# Patient Record
Sex: Female | Born: 1965 | Race: White | Hispanic: No | Marital: Married | Smoking: Former smoker
Health system: Southern US, Community
[De-identification: ages and names within clinical notes are randomized; demographics above are authoritative.]

## PROBLEM LIST (undated history)

## (undated) DIAGNOSIS — I10 Essential (primary) hypertension: Secondary | ICD-10-CM

## (undated) HISTORY — PX: TUBAL LIGATION: SHX77

## (undated) HISTORY — PX: OTHER SURGICAL HISTORY: SHX169

## (undated) HISTORY — PX: DILATION AND CURETTAGE OF UTERUS: SHX78

## (undated) HISTORY — DX: Essential (primary) hypertension: I10

---

## 2004-04-20 ENCOUNTER — Ambulatory Visit (HOSPITAL_COMMUNITY): Admission: RE | Admit: 2004-04-20 | Discharge: 2004-04-20 | Payer: Self-pay | Admitting: Family Medicine

## 2004-12-15 ENCOUNTER — Ambulatory Visit (HOSPITAL_COMMUNITY): Admission: RE | Admit: 2004-12-15 | Discharge: 2004-12-15 | Payer: Self-pay | Admitting: Family Medicine

## 2005-01-10 ENCOUNTER — Ambulatory Visit (HOSPITAL_COMMUNITY): Admission: RE | Admit: 2005-01-10 | Discharge: 2005-01-10 | Payer: Self-pay | Admitting: Family Medicine

## 2006-02-06 ENCOUNTER — Ambulatory Visit: Payer: Self-pay | Admitting: Orthopedic Surgery

## 2007-03-22 ENCOUNTER — Ambulatory Visit: Payer: Self-pay | Admitting: Orthopedic Surgery

## 2007-03-28 ENCOUNTER — Ambulatory Visit (HOSPITAL_COMMUNITY): Admission: RE | Admit: 2007-03-28 | Discharge: 2007-03-28 | Payer: Self-pay | Admitting: Orthopedic Surgery

## 2007-04-04 ENCOUNTER — Ambulatory Visit: Payer: Self-pay | Admitting: Orthopedic Surgery

## 2008-03-19 ENCOUNTER — Ambulatory Visit (HOSPITAL_COMMUNITY): Admission: RE | Admit: 2008-03-19 | Discharge: 2008-03-19 | Payer: Self-pay | Admitting: Unknown Physician Specialty

## 2010-03-23 DIAGNOSIS — L240 Irritant contact dermatitis due to detergents: Secondary | ICD-10-CM | POA: Insufficient documentation

## 2010-12-11 ENCOUNTER — Encounter: Payer: Self-pay | Admitting: Family Medicine

## 2010-12-12 ENCOUNTER — Encounter: Payer: Self-pay | Admitting: Orthopedic Surgery

## 2012-02-06 ENCOUNTER — Other Ambulatory Visit (HOSPITAL_COMMUNITY): Payer: Self-pay | Admitting: Family Medicine

## 2012-02-06 DIAGNOSIS — Z139 Encounter for screening, unspecified: Secondary | ICD-10-CM

## 2012-02-21 ENCOUNTER — Ambulatory Visit (HOSPITAL_COMMUNITY)
Admission: RE | Admit: 2012-02-21 | Discharge: 2012-02-21 | Disposition: A | Discharge status: Home / Self Care | Payer: BC Managed Care – PPO | Source: Ambulatory Visit | Attending: Family Medicine | Admitting: Family Medicine

## 2012-02-21 DIAGNOSIS — Z139 Encounter for screening, unspecified: Secondary | ICD-10-CM

## 2012-02-21 DIAGNOSIS — Z1231 Encounter for screening mammogram for malignant neoplasm of breast: Secondary | ICD-10-CM | POA: Insufficient documentation

## 2014-11-19 ENCOUNTER — Other Ambulatory Visit: Payer: Self-pay | Admitting: Obstetrics and Gynecology

## 2014-11-19 DIAGNOSIS — R928 Other abnormal and inconclusive findings on diagnostic imaging of breast: Secondary | ICD-10-CM

## 2014-12-02 ENCOUNTER — Encounter (HOSPITAL_COMMUNITY): Payer: BC Managed Care – PPO

## 2014-12-09 ENCOUNTER — Ambulatory Visit
Admission: RE | Admit: 2014-12-09 | Discharge: 2014-12-09 | Disposition: A | Discharge status: Home / Self Care | Payer: BC Managed Care – PPO | Source: Ambulatory Visit | Attending: Obstetrics and Gynecology | Admitting: Obstetrics and Gynecology

## 2014-12-09 ENCOUNTER — Ambulatory Visit (HOSPITAL_COMMUNITY)
Admission: RE | Admit: 2014-12-09 | Discharge: 2014-12-09 | Disposition: A | Discharge status: Home / Self Care | Payer: BC Managed Care – PPO | Source: Ambulatory Visit | Attending: Obstetrics and Gynecology | Admitting: Obstetrics and Gynecology

## 2014-12-09 DIAGNOSIS — R928 Other abnormal and inconclusive findings on diagnostic imaging of breast: Secondary | ICD-10-CM

## 2015-11-18 ENCOUNTER — Ambulatory Visit: Payer: BC Managed Care – PPO | Admitting: Podiatry

## 2015-11-20 ENCOUNTER — Encounter: Payer: Self-pay | Admitting: Podiatry

## 2015-11-20 ENCOUNTER — Ambulatory Visit (INDEPENDENT_AMBULATORY_CARE_PROVIDER_SITE_OTHER): Payer: BC Managed Care – PPO | Admitting: Podiatry

## 2015-11-20 ENCOUNTER — Ambulatory Visit (INDEPENDENT_AMBULATORY_CARE_PROVIDER_SITE_OTHER): Payer: BC Managed Care – PPO

## 2015-11-20 VITALS — BP 160/104 | HR 89 | Resp 12

## 2015-11-20 DIAGNOSIS — M779 Enthesopathy, unspecified: Secondary | ICD-10-CM

## 2015-11-20 MED ORDER — TRIAMCINOLONE ACETONIDE 10 MG/ML IJ SUSP
10.0000 mg | Freq: Once | INTRAMUSCULAR | Status: AC
  Administered 2015-11-20: 10 mg

## 2015-11-20 MED ORDER — DICLOFENAC SODIUM 75 MG PO TBEC: 75.0000 mg | DELAYED_RELEASE_TABLET | Freq: Two times a day (BID) | ORAL | Status: DC

## 2015-11-20 NOTE — Progress Notes (Signed)
   Subjective:    Patient ID: Breanna Wang, female    DOB: 10/18/1966, 49 y.o.   MRN: [id number]  HPI  PT STATED RT BALL OF THE FOOT BEEN HURTING FOR 3 MONTHS. FOOT GETTING WORSE ESPECIALLY WHEN SITTING/WALKING. TRIED NO TREATMENT.  Review of Systems  Musculoskeletal: Positive for gait problem.       Objective:   Physical Exam        Assessment & Plan:

## 2015-11-20 NOTE — Progress Notes (Signed)
Subjective:     Patient ID: Breanna Wang, female   DOB: 1966-11-04, 49 y.o.   MRN: [id number]  HPI patient states I been getting a lot of pain on the inside of my right foot for the last 3 months. Patient states it's C worse when starting to walk after sitting   Review of Systems  All other systems reviewed and are negative.      Objective:   Physical Exam  Constitutional: She is oriented to person, place, and time.  Cardiovascular: Intact distal pulses.   Musculoskeletal: Normal range of motion.  Neurological: She is oriented to person, place, and time.  Skin: Skin is warm.  Nursing note and vitals reviewed.  neurovascular status intact muscle strength adequate range of motion within normal limits with patient noted to have quite a bit of discomfort in medial side of the right plantar first metatarsal with inflammation noted around the plantar medial capsule but not within the sesamoidal complex are within the metatarsal head itself. Good digital perfusion is noted and well oriented 3     Assessment:      appears to be an inflammatory capsulitis of the medial aspect of the right first metatarsal localized in nature    Plan:      H&P and condition reviewed with patient and at this time I carefully injected the medial side of the capsule 3 mg Kenalog 5 mg Xylocaine advised on physical therapy and placed on diclofenac 75 mg twice a day. Reappoint for [place] to recheck if symptoms persist

## 2017-03-28 ENCOUNTER — Ambulatory Visit (HOSPITAL_COMMUNITY)
Admission: RE | Admit: 2017-03-28 | Discharge: 2017-03-28 | Disposition: A | Discharge status: Home / Self Care | Payer: BC Managed Care – PPO | Source: Ambulatory Visit | Attending: Family Medicine | Admitting: Family Medicine

## 2017-03-28 ENCOUNTER — Other Ambulatory Visit (HOSPITAL_COMMUNITY): Payer: Self-pay | Admitting: Family Medicine

## 2017-03-28 DIAGNOSIS — R52 Pain, unspecified: Secondary | ICD-10-CM

## 2017-03-28 DIAGNOSIS — M545 Low back pain: Secondary | ICD-10-CM | POA: Diagnosis present

## 2017-09-11 ENCOUNTER — Encounter (INDEPENDENT_AMBULATORY_CARE_PROVIDER_SITE_OTHER): Payer: Self-pay | Admitting: *Deleted

## 2017-11-29 ENCOUNTER — Other Ambulatory Visit (INDEPENDENT_AMBULATORY_CARE_PROVIDER_SITE_OTHER): Payer: Self-pay | Admitting: *Deleted

## 2017-11-29 DIAGNOSIS — Z1211 Encounter for screening for malignant neoplasm of colon: Secondary | ICD-10-CM | POA: Insufficient documentation

## 2018-02-13 DIAGNOSIS — H02831 Dermatochalasis of right upper eyelid: Secondary | ICD-10-CM | POA: Insufficient documentation

## 2018-02-13 DIAGNOSIS — H02834 Dermatochalasis of left upper eyelid: Secondary | ICD-10-CM

## 2018-03-20 ENCOUNTER — Encounter (INDEPENDENT_AMBULATORY_CARE_PROVIDER_SITE_OTHER): Payer: Self-pay | Admitting: *Deleted

## 2018-03-20 ENCOUNTER — Telehealth (INDEPENDENT_AMBULATORY_CARE_PROVIDER_SITE_OTHER): Payer: Self-pay | Admitting: *Deleted

## 2018-03-20 MED ORDER — PEG 3350-KCL-NA BICARB-NACL 420 G PO SOLR: 4000.0000 mL | Freq: Once | ORAL | 0 refills | Status: AC

## 2018-03-20 NOTE — Telephone Encounter (Signed)
Patient needs trilyte 

## 2018-04-09 ENCOUNTER — Telehealth (INDEPENDENT_AMBULATORY_CARE_PROVIDER_SITE_OTHER): Payer: Self-pay | Admitting: *Deleted

## 2018-04-09 NOTE — Telephone Encounter (Signed)
Referring MD/PCP: golding   Procedure: tcs  Reason/Indication:  screening  Has patient had this procedure before?  no  If so, when, by whom and where?    Is there a family history of colon cancer?  no  Who?  What age when diagnosed?    Is patient diabetic?   no      Does patient have prosthetic heart valve or mechanical valve?  no  Do you have a pacemaker?  no  Has patient ever had endocarditis? no  Has patient had joint replacement within last 12 months?  no  Is patient constipated or do they take laxatives? no  Does patient have a history of alcohol/drug use?  no  Is patient on blood thinner such as Coumadin, Plavix and/or Aspirin? o  Medications: enalapril 20 mg bid  Allergies: nkda  Medication Adjustment per Dr Lindi Adie, NP:   Procedure date & time: 05/09/18 at 830

## 2018-04-09 NOTE — Telephone Encounter (Signed)
agree

## 2018-05-09 ENCOUNTER — Ambulatory Visit (HOSPITAL_COMMUNITY)
Admission: RE | Admit: 2018-05-09 | Discharge: 2018-05-09 | Disposition: A | Discharge status: Home / Self Care | Payer: BC Managed Care – PPO | Source: Ambulatory Visit | Attending: Internal Medicine | Admitting: Internal Medicine

## 2018-05-09 ENCOUNTER — Encounter (HOSPITAL_COMMUNITY): Payer: Self-pay | Admitting: *Deleted

## 2018-05-09 ENCOUNTER — Encounter (HOSPITAL_COMMUNITY)
Admission: RE | Disposition: A | Discharge status: Home / Self Care | Payer: Self-pay | Source: Ambulatory Visit | Attending: Internal Medicine

## 2018-05-09 ENCOUNTER — Other Ambulatory Visit: Payer: Self-pay

## 2018-05-09 DIAGNOSIS — Z8249 Family history of ischemic heart disease and other diseases of the circulatory system: Secondary | ICD-10-CM | POA: Diagnosis not present

## 2018-05-09 DIAGNOSIS — Z79899 Other long term (current) drug therapy: Secondary | ICD-10-CM | POA: Insufficient documentation

## 2018-05-09 DIAGNOSIS — Z87891 Personal history of nicotine dependence: Secondary | ICD-10-CM | POA: Diagnosis not present

## 2018-05-09 DIAGNOSIS — D123 Benign neoplasm of transverse colon: Secondary | ICD-10-CM | POA: Insufficient documentation

## 2018-05-09 DIAGNOSIS — Z1211 Encounter for screening for malignant neoplasm of colon: Secondary | ICD-10-CM | POA: Insufficient documentation

## 2018-05-09 DIAGNOSIS — I10 Essential (primary) hypertension: Secondary | ICD-10-CM | POA: Diagnosis not present

## 2018-05-09 HISTORY — PX: POLYPECTOMY: SHX5525

## 2018-05-09 HISTORY — PX: COLONOSCOPY: SHX5424

## 2018-05-09 SURGERY — COLONOSCOPY
Anesthesia: Moderate Sedation

## 2018-05-09 MED ORDER — MEPERIDINE HCL 50 MG/ML IJ SOLN
INTRAMUSCULAR | Status: AC
  Filled 2018-05-09: qty 1

## 2018-05-09 MED ORDER — MEPERIDINE HCL 50 MG/ML IJ SOLN
INTRAMUSCULAR | Status: DC | PRN
  Administered 2018-05-09 (×2): 25 mg via INTRAVENOUS

## 2018-05-09 MED ORDER — SODIUM CHLORIDE 0.9 % IV SOLN
INTRAVENOUS | Status: DC
  Administered 2018-05-09: 1000 mL via INTRAVENOUS

## 2018-05-09 MED ORDER — MIDAZOLAM HCL 5 MG/5ML IJ SOLN
INTRAMUSCULAR | Status: DC | PRN
  Administered 2018-05-09 (×3): 2 mg via INTRAVENOUS

## 2018-05-09 MED ORDER — MIDAZOLAM HCL 5 MG/5ML IJ SOLN
INTRAMUSCULAR | Status: AC
  Filled 2018-05-09: qty 10

## 2018-05-09 MED ORDER — STERILE WATER FOR IRRIGATION IR SOLN
Status: DC | PRN
  Administered 2018-05-09: 08:00:00

## 2018-05-09 NOTE — H&P (Addendum)
Breanna Wang is an 52 y.o. female.   Chief Complaint: Patient is here for colonoscopy. HPI: Patient is a 52 year old Caucasian female who is here for screening colonoscopy.  She denies abdominal pain change in bowel habits or rectal bleeding. Family history is negative for CRC.  Past Medical History:  Diagnosis Date  . Hypertension     Past Surgical History:  Procedure Laterality Date  . DILATION AND CURETTAGE OF UTERUS    . TUBAL LIGATION    . uterine ablation      Family History  Problem Relation Age of Onset  . Hypertension Mother   . Diabetes Father   . Lung cancer Father    Social History:  reports that she has quit smoking. She has never used smokeless tobacco. She reports that she drinks alcohol. She reports that she does not use drugs.  Allergies: No Known Allergies  Medications Prior to Admission  Medication Sig Dispense Refill  . enalapril (VASOTEC) 20 MG tablet Take 40 mg by mouth daily.       No results found for this or any previous visit (from the past 48 hour(s)). No results found.  ROS  Blood pressure 138/87, pulse 88, temperature 98.1 F (36.7 C), temperature source Oral, resp. rate 18, last menstrual period 05/08/2018, SpO2 98 %. Physical Exam  Constitutional: She appears well-developed and well-nourished.  HENT:  Mouth/Throat: Oropharynx is clear and moist.  Eyes: Conjunctivae are normal. No scleral icterus.  Neck: No thyromegaly present.  Cardiovascular: Normal rate, regular rhythm and normal heart sounds.  No murmur heard. Respiratory: Effort normal and breath sounds normal.  GI: Soft. She exhibits no distension and no mass. There is no tenderness.  Musculoskeletal: She exhibits no edema.  Neurological: She is alert.  Skin: Skin is warm and dry.     Assessment/Plan Average risk screening colonoscopy.  Hildred Laser, MD 05/09/2018, 8:23 AM

## 2018-05-09 NOTE — Op Note (Signed)
[facility] Patient Name: Breanna Wang Procedure Date: 05/09/2018 8:21 AM MRN: [id number] Date of Birth: 08/04/1966 Attending MD: Hildred Laser , MD CSN: [id number] Age: 52 Admit Type: Outpatient Procedure:                Colonoscopy Indications:              Screening for colorectal malignant neoplasm Providers:                Hildred Laser, MD, Jeanann Lewandowsky. Sharon Seller, RN, Nelma Rothman, Technician Referring MD:             Halford Chessman, MD Medicines:                Meperidine 50 mg IV, Midazolam 6 mg IV Complications:            No immediate complications. Estimated Blood Loss:     Estimated blood loss was minimal. Procedure:                Pre-Anesthesia Assessment:                           - Prior to the procedure, a History and Physical                            was performed, and patient medications and                            allergies were reviewed. The patient's tolerance of                            previous anesthesia was also reviewed. The risks                            and benefits of the procedure and the sedation                            options and risks were discussed with the patient.                            All questions were answered, and informed consent                            was obtained. Prior Anticoagulants: The patient has                            taken no previous anticoagulant or antiplatelet                            agents. ASA Grade Assessment: I - A normal, healthy                            patient. After reviewing the risks and benefits,  the patient was deemed in satisfactory condition to                            undergo the procedure.                           After obtaining informed consent, the colonoscope                            was passed under direct vision. Throughout the                            procedure, the patient's blood pressure, pulse, and                     oxygen saturations were monitored continuously. The                            EC-[id number] ([id number]) scope was introduced through                            the anus and advanced to the the cecum, identified                            by appendiceal orifice and ileocecal valve. The                            colonoscopy was performed without difficulty. The                            patient tolerated the procedure well. The quality                            of the bowel preparation was excellent. The                            ileocecal valve, appendiceal orifice, and rectum                            were photographed. Scope In: 8:32:30 AM Scope Out: 8:53:27 AM Scope Withdrawal Time: 0 hours 10 minutes 39 seconds  Total Procedure Duration: 0 hours 20 minutes 57 seconds  Findings:      The perianal and digital rectal examinations were normal.      A small polyp was found in the proximal transverse colon. The polyp was       sessile. Biopsies were taken with a cold forceps for histology.      The exam was otherwise normal throughout the examined colon.      The retroflexed view of the distal rectum and anal verge was normal and       showed no anal or rectal abnormalities. Impression:               - One small polyp in the proximal transverse colon.  Biopsied. Moderate Sedation:      Moderate (conscious) sedation was administered by the endoscopy nurse       and supervised by the endoscopist. The following parameters were       monitored: oxygen saturation, heart rate, blood pressure, CO2       capnography and response to care. Total physician intraservice time was       27 minutes. Recommendation:           - Patient has a contact number available for                            emergencies. The signs and symptoms of potential                            delayed complications were discussed with the                            patient. Return  to normal activities tomorrow.                            Written discharge instructions were provided to the                            patient.                           - Resume previous diet today.                           - Continue present medications.                           - No aspirin, ibuprofen, naproxen, or other                            non-steroidal anti-inflammatory drugs for 1 day.                           - Await pathology results.                           - Repeat colonoscopy is recommended. The                            colonoscopy date will be determined after pathology                            results from today's exam become available for                            review. Procedure Code(s):        --- Professional ---                           [phone], Colonoscopy, flexible; with biopsy, single  or multiple                           G0500, Moderate sedation services provided by the                            same physician or other qualified health care                            professional performing a gastrointestinal                            endoscopic service that sedation supports,                            requiring the presence of an independent trained                            observer to assist in the monitoring of the                            patient's level of consciousness and physiological                            status; initial 15 minutes of intra-service time;                            patient age 22 years or older (additional time may                            be reported with [phone], as appropriate)                           [phone], Moderate sedation services provided by the                            same physician or other qualified health care                            professional performing the diagnostic or                            therapeutic service that the sedation supports,                             requiring the presence of an independent trained                            observer to assist in the monitoring of the                            patient's level of consciousness and physiological                            status;  each additional 15 minutes intraservice                            time (List separately in addition to code for                            primary service) Diagnosis Code(s):        --- Professional ---                           Z12.11, Encounter for screening for malignant                            neoplasm of colon                           D12.3, Benign neoplasm of transverse colon (hepatic                            flexure or splenic flexure) CPT copyright 2017 American Medical Association. All rights reserved. The codes documented in this report are preliminary and upon coder review may  be revised to meet current compliance requirements. Hildred Laser, MD Hildred Laser, MD 05/09/2018 8:59:51 AM This report has been signed electronically. Number of Addenda: 0

## 2018-05-09 NOTE — Discharge Instructions (Signed)
No aspirin or NSAIDs for 24 hours. Resume enalapril at usual dose. Resume usual diet.   No driving for 24 hours Physician will call with biopsy results.       Colonoscopy, Adult, Care After This sheet gives you information about how to care for yourself after your procedure. Your doctor may also give you more specific instructions. If you have problems or questions, call your doctor. Follow these instructions at home: General instructions   For the first 24 hours after the procedure: ? Do not drive or use machinery. ? Do not sign important documents. ? Do not drink alcohol. ? Do your daily activities more slowly than normal. ? Eat foods that are soft and easy to digest. ? Rest often.  Take over-the-counter or prescription medicines only as told by your doctor.  It is up to you to get the results of your procedure. Ask your doctor, or the department performing the procedure, when your results will be ready. To help cramping and bloating:  Try walking around.  Put heat on your belly (abdomen) as told by your doctor. Use a heat source that your doctor recommends, such as a moist heat pack or a heating pad. ? Put a towel between your skin and the heat source. ? Leave the heat on for 20-30 minutes. ? Remove the heat if your skin turns bright red. This is especially important if you cannot feel pain, heat, or cold. You can get burned. Eating and drinking  Drink enough fluid to keep your pee (urine) clear or pale yellow.  Return to your normal diet as told by your doctor. Avoid heavy or fried foods that are hard to digest.  Avoid drinking alcohol for as long as told by your doctor. Contact a doctor if:  You have blood in your poop (stool) 2-3 days after the procedure. Get help right away if:  You have more than a small amount of blood in your poop.  You see large clumps of tissue (blood clots) in your poop.  Your belly is swollen.  You feel sick to your stomach  (nauseous).  You throw up (vomit).  You have a fever.  You have belly pain that gets worse, and medicine does not help your pain. This information is not intended to replace advice given to you by your health care provider. Make sure you discuss any questions you have with your health care provider. Document Released: 12/10/2010 Document Revised: 08/01/2016 Document Reviewed: 08/01/2016 Elsevier Interactive Patient Education  2017 [place].     Colon Polyps Polyps are tissue growths inside the body. Polyps can grow in many places, including the large intestine (colon). A polyp may be a round bump or a mushroom-shaped growth. You could have one polyp or several. Most colon polyps are noncancerous (benign). However, some colon polyps can become cancerous over time. What are the causes? The exact cause of colon polyps is not known. What increases the risk? This condition is more likely to develop in people who:  Have a family history of colon cancer or colon polyps.  Are older than 21 or older than 45 if they are African American.  Have inflammatory bowel disease, such as ulcerative colitis or Crohn disease.  Are overweight.  Smoke cigarettes.  Do not get enough exercise.  Drink too much alcohol.  Eat a diet that is: ? High in fat and red meat. ? Low in fiber.  Had childhood cancer that was treated with abdominal radiation.  What are  the signs or symptoms? Most polyps do not cause symptoms. If you have symptoms, they may include:  Blood coming from your rectum when having a bowel movement.  Blood in your stool.The stool may look dark red or black.  A change in bowel habits, such as constipation or diarrhea.  How is this diagnosed? This condition is diagnosed with a colonoscopy. This is a procedure that uses a lighted, flexible scope to look at the inside of your colon. How is this treated? Treatment for this condition involves removing any polyps that are  found. Those polyps will then be tested for cancer. If cancer is found, your health care provider will talk to you about options for colon cancer treatment. Follow these instructions at home: Diet  Eat plenty of fiber, such as fruits, vegetables, and whole grains.  Eat foods that are high in calcium and vitamin D, such as milk, cheese, yogurt, eggs, liver, fish, and broccoli.  Limit foods high in fat, red meats, and processed meats, such as hot dogs, sausage, bacon, and lunch meats.  Maintain a healthy weight, or lose weight if recommended by your health care provider. General instructions  Do not smoke cigarettes.  Do not drink alcohol excessively.  Keep all follow-up visits as told by your health care provider. This is important. This includes keeping regularly scheduled colonoscopies. Talk to your health care provider about when you need a colonoscopy.  Exercise every day or as told by your health care provider. Contact a health care provider if:  You have new or worsening bleeding during a bowel movement.  You have new or increased blood in your stool.  You have a change in bowel habits.  You unexpectedly lose weight. This information is not intended to replace advice given to you by your health care provider. Make sure you discuss any questions you have with your health care provider. Document Released: 08/03/2004 Document Revised: 04/14/2016 Document Reviewed: 09/28/2015 Elsevier Interactive Patient Education  [employer].

## 2018-05-14 ENCOUNTER — Encounter (HOSPITAL_COMMUNITY): Payer: Self-pay | Admitting: Internal Medicine

## 2018-10-05 ENCOUNTER — Encounter (HOSPITAL_COMMUNITY): Payer: Self-pay | Admitting: *Deleted

## 2018-10-05 ENCOUNTER — Emergency Department (HOSPITAL_COMMUNITY)
Admission: EM | Admit: 2018-10-05 | Discharge: 2018-10-05 | Disposition: A | Discharge status: Home / Self Care | Payer: BC Managed Care – PPO | Attending: Emergency Medicine | Admitting: Emergency Medicine

## 2018-10-05 ENCOUNTER — Emergency Department (HOSPITAL_COMMUNITY): Payer: BC Managed Care – PPO

## 2018-10-05 ENCOUNTER — Other Ambulatory Visit: Payer: Self-pay

## 2018-10-05 DIAGNOSIS — Z87891 Personal history of nicotine dependence: Secondary | ICD-10-CM | POA: Diagnosis not present

## 2018-10-05 DIAGNOSIS — R0602 Shortness of breath: Secondary | ICD-10-CM | POA: Insufficient documentation

## 2018-10-05 DIAGNOSIS — I10 Essential (primary) hypertension: Secondary | ICD-10-CM | POA: Insufficient documentation

## 2018-10-05 DIAGNOSIS — R002 Palpitations: Secondary | ICD-10-CM | POA: Diagnosis present

## 2018-10-05 DIAGNOSIS — Z79899 Other long term (current) drug therapy: Secondary | ICD-10-CM | POA: Diagnosis not present

## 2018-10-05 LAB — CBC WITH DIFFERENTIAL/PLATELET
Abs Immature Granulocytes: 0.03 10*3/uL (ref 0.00–0.07)
BASOS ABS: 0.1 10*3/uL (ref 0.0–0.1)
Basophils Relative: 1 %
Eosinophils Absolute: 0.4 10*3/uL (ref 0.0–0.5)
Eosinophils Relative: 3 %
HCT: 41.8 % (ref 36.0–46.0)
HEMOGLOBIN: 13.7 g/dL (ref 12.0–15.0)
IMMATURE GRANULOCYTES: 0 %
Lymphocytes Relative: 26 %
Lymphs Abs: 3 10*3/uL (ref 0.7–4.0)
MCH: 30.7 pg (ref 26.0–34.0)
MCHC: 32.8 g/dL (ref 30.0–36.0)
MCV: 93.7 fL (ref 80.0–100.0)
Monocytes Absolute: 0.7 10*3/uL (ref 0.1–1.0)
Monocytes Relative: 6 %
NEUTROS PCT: 64 %
NRBC: 0 % (ref 0.0–0.2)
Neutro Abs: 7.2 10*3/uL (ref 1.7–7.7)
Platelets: 305 10*3/uL (ref 150–400)
RBC: 4.46 MIL/uL (ref 3.87–5.11)
RDW: 13 % (ref 11.5–15.5)
WBC: 11.4 10*3/uL — ABNORMAL HIGH (ref 4.0–10.5)

## 2018-10-05 LAB — COMPREHENSIVE METABOLIC PANEL
ALBUMIN: 4.1 g/dL (ref 3.5–5.0)
ALK PHOS: 48 U/L (ref 38–126)
ALT: 13 U/L (ref 0–44)
ANION GAP: 7 (ref 5–15)
AST: 14 U/L — AB (ref 15–41)
BUN: 11 mg/dL (ref 6–20)
CALCIUM: 9.4 mg/dL (ref 8.9–10.3)
CO2: 27 mmol/L (ref 22–32)
Chloride: 106 mmol/L (ref 98–111)
Creatinine, Ser: 0.41 mg/dL — ABNORMAL LOW (ref 0.44–1.00)
GFR calc Af Amer: >60 mL/min (ref >60)
GFR calc non Af Amer: >60 mL/min (ref >60)
GLUCOSE: 90 mg/dL (ref 70–99)
Potassium: 4 mmol/L (ref 3.5–5.1)
SODIUM: 140 mmol/L (ref 135–145)
Total Bilirubin: 0.7 mg/dL (ref 0.3–1.2)
Total Protein: 7.2 g/dL (ref 6.5–8.1)

## 2018-10-05 LAB — TROPONIN I: Troponin I: <0.03 ng/mL (ref <0.03)

## 2018-10-05 NOTE — ED Provider Notes (Signed)
[facility] EMERGENCY DEPARTMENT Provider Note   CSN: [id number] Arrival date & time: 10/05/18  1110     History   Chief Complaint Chief Complaint  Patient presents with  . Irregular Heart Beat    Breanna Wang is a 52 y.o. female.  Patient complains of having palpitations and some shortness of breath.  Patient states symptoms have improved now no chest pain no sweating  The history is provided by the patient. No language interpreter was used.  Palpitations   This is a new problem. The current episode started 1 to 2 hours ago. The problem occurs rarely. The problem has been resolved. The problem is associated with an unknown factor. Pertinent negatives include no diaphoresis, no chest pain, no abdominal pain, no headaches, no back pain and no cough.    Past Medical History:  Diagnosis Date  . Hypertension     Patient Active Problem List   Diagnosis Date Noted  . Special screening for malignant neoplasms, colon 11/29/2017    Past Surgical History:  Procedure Laterality Date  . COLONOSCOPY N/A 05/09/2018   Procedure: COLONOSCOPY;  Surgeon: Rogene Houston, MD;  Location: [facility];  Service: Endoscopy;  Laterality: N/A;  830  . DILATION AND CURETTAGE OF UTERUS    . POLYPECTOMY  05/09/2018   Procedure: POLYPECTOMY;  Surgeon: Rogene Houston, MD;  Location: [facility];  Service: Endoscopy;;  colon  . TUBAL LIGATION    . uterine ablation       OB History   None      Home Medications    Prior to Admission medications   Medication Sig Start Date End Date Taking? Authorizing Provider  diltiazem (CARDIZEM CD) 180 MG 24 hr capsule Take 1 capsule by mouth daily. 08/17/18  Yes [provider]  omeprazole (PRILOSEC) 40 MG capsule Take 1 capsule by mouth daily. 09/19/18  Yes [provider]    Family History Family History  Problem Relation Age of Onset  . Hypertension Mother   . Diabetes Father   . Lung cancer Father      Social History Social History   Tobacco Use  . Smoking status: Former Research scientist (life sciences)  . Smokeless tobacco: Never Used  . Tobacco comment: quit at age 4  Substance Use Topics  . Alcohol use: Yes    Comment: occasionally  . Drug use: No     Allergies   Patient has no known allergies.   Review of Systems Review of Systems  Constitutional: Negative for appetite change, diaphoresis and fatigue.  HENT: Negative for congestion, ear discharge and sinus pressure.   Eyes: Negative for discharge.  Respiratory: Negative for cough.   Cardiovascular: Positive for palpitations. Negative for chest pain.  Gastrointestinal: Negative for abdominal pain and diarrhea.  Genitourinary: Negative for frequency and hematuria.  Musculoskeletal: Negative for back pain.  Skin: Negative for rash.  Neurological: Negative for seizures and headaches.  Psychiatric/Behavioral: Negative for hallucinations.     Physical Exam Updated Vital Signs BP (!) 144/89   Pulse 72   Resp 17   Ht 5\' 5"  (1.651 m)   Wt 81.6 kg   LMP 09/20/2018   SpO2 98%   BMI 29.95 kg/m   Physical Exam  Constitutional: She is oriented to person, place, and time. She appears well-developed.  HENT:  Head: Normocephalic.  Eyes: Conjunctivae and EOM are normal. No scleral icterus.  Neck: Neck supple. No thyromegaly present.  Cardiovascular: Normal rate and regular rhythm. Exam  reveals no gallop and no friction rub.  No murmur heard. Pulmonary/Chest: No stridor. She has no wheezes. She has no rales. She exhibits no tenderness.  Abdominal: She exhibits no distension. There is no tenderness. There is no rebound.  Musculoskeletal: Normal range of motion. She exhibits no edema.  Lymphadenopathy:    She has no cervical adenopathy.  Neurological: She is oriented to person, place, and time. She exhibits normal muscle tone. Coordination normal.  Skin: No rash noted. No erythema.  Psychiatric: She has a normal mood and affect. Her  behavior is normal.     ED Treatments / Results  Labs (all labs ordered are listed, but only abnormal results are displayed) Labs Reviewed  CBC WITH DIFFERENTIAL/PLATELET - Abnormal; Notable for the following components:      Result Value   WBC 11.4 (*)    All other components within normal limits  COMPREHENSIVE METABOLIC PANEL - Abnormal; Notable for the following components:   Creatinine, Ser 0.41 (*)    AST 14 (*)    All other components within normal limits  TROPONIN I    EKG EKG Interpretation  Date/Time:  Friday October 05 2018 11:31:38 EST Ventricular Rate:  90 PR Interval:    QRS Duration: 99 QT Interval:  370 QTC Calculation: 453 R Axis:   21 Text Interpretation:  Sinus rhythm Left atrial enlargement Confirmed by Milton Ferguson [id number]) on 10/05/2018 12:41:36 PM   Radiology Dg Chest 2 View  Result Date: 10/05/2018 CLINICAL DATA:  Rapid heart rate today. No chest pain. Weakness. History of hypertension, former smoker. EXAM: CHEST - 2 VIEW COMPARISON:  None. FINDINGS: The lungs are adequately inflated. There is no focal infiltrate. There is a subcentimeter calcified nodule in the right middle lobe anteriorly. The heart and pulmonary vascularity are normal. The mediastinum is normal in width. The trachea is midline. There is no pleural effusion. The bony thorax exhibits no acute abnormality. IMPRESSION: There is no active cardiopulmonary disease. Electronically Signed   By: David  Martinique M.D.   On: 10/05/2018 13:45    Procedures Procedures (including critical care time)  Medications Ordered in ED Medications - No data to display   Initial Impression / Assessment and Plan / ED Course  I have reviewed the triage vital signs and the nursing notes.  Pertinent labs & imaging results that were available during my care of the patient were reviewed by me and considered in my medical decision making (see chart for details).    Patient with palpitations that have  resolved.  Labs EKG chest x-ray unremarkable.  Patient will be referred to cardiology for possible stress test or monitor.  Patient will stop usage of caffeine and follow-up sooner with her family doctor if problems  Final Clinical Impressions(s) / ED Diagnoses   Final diagnoses:  Palpitations    ED Discharge Orders    None       Milton Ferguson, MD 10/05/18 1431

## 2018-10-05 NOTE — Discharge Instructions (Signed)
Follow-up with Dr. Bronson Ing or 1 of his partners in the next couple weeks for your palpitations.  Decrease caffeine usage.  Return if any problems

## 2018-10-05 NOTE — ED Triage Notes (Signed)
Fast heart rate onset today,denies pain

## 2018-10-09 ENCOUNTER — Ambulatory Visit: Payer: BC Managed Care – PPO | Admitting: Internal Medicine

## 2018-10-09 ENCOUNTER — Ambulatory Visit: Payer: BC Managed Care – PPO | Admitting: Cardiovascular Disease

## 2018-10-09 ENCOUNTER — Encounter: Payer: Self-pay | Admitting: Internal Medicine

## 2018-10-09 VITALS — BP 152/90 | HR 96 | Ht 65.0 in | Wt 179.8 lb

## 2018-10-09 DIAGNOSIS — R002 Palpitations: Secondary | ICD-10-CM

## 2018-10-09 DIAGNOSIS — R0609 Other forms of dyspnea: Secondary | ICD-10-CM

## 2018-10-09 DIAGNOSIS — I10 Essential (primary) hypertension: Secondary | ICD-10-CM | POA: Diagnosis not present

## 2018-10-09 NOTE — Patient Instructions (Signed)
Medication Instructions:  Your physician recommends that you continue on your current medications as directed. Please refer to the Current Medication list given to you today.  If you need a refill on your cardiac medications before your next appointment, please call your pharmacy.   Lab work: None ordered If you have labs (blood work) drawn today and your tests are completely normal, you will receive your results only by: Marland Kitchen MyChart Message (if you have MyChart) OR . A paper copy in the mail If you have any lab test that is abnormal or we need to change your treatment, we will call you to review the results.  Testing/Procedures: Your physician has requested that you have an echocardiogram. Echocardiography is a painless test that uses sound waves to create images of your heart. It provides your doctor with information about the size and shape of your heart and how well your heart's chambers and valves are working. This procedure takes approximately one hour. There are no restrictions for this procedure.  Your physician has recommended that you wear a holter monitor. Holter monitors are medical devices that record the heart's electrical activity. Doctors most often use these monitors to diagnose arrhythmias. Arrhythmias are problems with the speed or rhythm of the heartbeat. The monitor is a small, portable device. You can wear one while you do your normal daily activities. This is usually used to diagnose what is causing palpitations/syncope (passing out).  Your physician has recommended that you wear ambulatory BP monitor.    Follow-Up: At [facility], you and your health needs are our priority.  As part of our continuing mission to provide you with exceptional heart care, we have created designated Provider Care Teams.  These Care Teams include your primary Cardiologist (physician) and Advanced Practice Providers (APPs -  Physician Assistants and Nurse Practitioners) who all work together to  provide you with the care you need, when you need it. You will need a follow up appointment in 3 months.   Dr.Achirya or one of the following Advanced Practice Providers on your designated Care Team:   Rosaria Ferries, PA-C . Jory Sims, DNP, ANP  Any Other Special Instructions Will Be Listed Below (If Applicable).

## 2018-10-28 DIAGNOSIS — I10 Essential (primary) hypertension: Secondary | ICD-10-CM | POA: Insufficient documentation

## 2018-10-28 DIAGNOSIS — R0609 Other forms of dyspnea: Secondary | ICD-10-CM | POA: Insufficient documentation

## 2018-10-28 DIAGNOSIS — R002 Palpitations: Secondary | ICD-10-CM | POA: Insufficient documentation

## 2018-10-28 NOTE — Progress Notes (Signed)
Cardiology Office Note:    Date:  10/28/2018   ID:  Breanna Wang, DOB 1966/02/19, MRN [id number]  PCP:  Sharilyn Sites, MD  Cardiologist:  Kate Sable, MD  Electrophysiologist:  None   Referring MD: Sharilyn Sites, MD   Post hospital follow up, palpitations, SOB  History of Present Illness:    Breanna Wang is a 52 y.o. female with a hx of hypertension who presents for hospital follow-up.  She is a patient of Dr. Kate Sable, I am seeing the patient in his absence.  She went to the emergency department on November 15 with palpitations and shortness of breath.  She tells me she has had symptoms since August primarily palpitations and shortness of breath with exertion.  She does have a history of snoring and has an Epworth Sleepiness Scale score of 16 but tells me she does not have sleep apnea.  She has a 30-60-pack-year smoking history.  She has recently had a 15 pound weight loss.  She had gestational diabetes with her second pregnancy, but otherwise did not have preeclampsia.  She has an elevated blood pressure on exam today.  152/90 mmHg.  The patient denies chest pain, chest pressure, dyspnea at rest, PND, orthopnea, or leg swelling. Denies syncope or presyncope. Denies dizziness or lightheadedness.  Endorses snoring but does not have sleep apnea.  Past Medical History:  Diagnosis Date  . Hypertension     Past Surgical History:  Procedure Laterality Date  . COLONOSCOPY N/A 05/09/2018   Procedure: COLONOSCOPY;  Surgeon: Rogene Houston, MD;  [facility];  Service: Endoscopy;  Laterality: N/A;  830  . DILATION AND CURETTAGE OF UTERUS    . POLYPECTOMY  05/09/2018   Procedure: POLYPECTOMY;  Surgeon: Rogene Houston, MD;  [facility];  Service: Endoscopy;;  colon  . TUBAL LIGATION    . uterine ablation      Current Medications: Current Meds  Medication Sig  . diltiazem (CARDIZEM CD) 180 MG 24 hr capsule Take 1 capsule by mouth  daily.  Marland Kitchen omeprazole (PRILOSEC) 40 MG capsule Take 1 capsule by mouth daily.     Allergies:   Patient has no known allergies.   Social History   Socioeconomic History  . Marital status: Married    Spouse name: Not on file  . Number of children: Not on file  . Years of education: Not on file  . Highest education level: Not on file  Occupational History  . Not on file  Social Needs  . Financial resource strain: Not on file  . Food insecurity:    Worry: Not on file    Inability: Not on file  . Transportation needs:    Medical: Not on file    Non-medical: Not on file  Tobacco Use  . Smoking status: Former Research scientist (life sciences)  . Smokeless tobacco: Never Used  . Tobacco comment: quit at age 57  Substance and Sexual Activity  . Alcohol use: Yes    Comment: occasionally  . Drug use: No  . Sexual activity: Not on file  Lifestyle  . Physical activity:    Days per week: Not on file    Minutes per session: Not on file  . Stress: Not on file  Relationships  . Social connections:    Talks on phone: Not on file    Gets together: Not on file    Attends religious service: Not on file    Active member of club or organization:  Not on file    Attends meetings of clubs or organizations: Not on file    Relationship status: Not on file  Other Topics Concern  . Not on file  Social History Narrative  . Not on file     Family History: The patient's family history includes Cancer in her paternal grandmother; Diabetes in her father; High blood pressure in her brother, father, paternal grandfather, and paternal grandmother; Hypertension in her mother; Lung cancer in her father; Thyroid disease (age of onset: 53) in her mother.  ROS:   Please see the history of present illness.    All other systems reviewed and are negative.  EKGs/Labs/Other Studies Reviewed:    The following studies were reviewed today:  EKG: Normal sinus rhythm, ventricular rate 96 bpm  Recent Labs: 10/05/2018: ALT 13; BUN  11; Creatinine, Ser 0.41; Hemoglobin 13.7; Platelets 305; Potassium 4.0; Sodium 140  Recent Lipid Panel No results found for: CHOL, TRIG, HDL, CHOLHDL, VLDL, LDLCALC, LDLDIRECT  Physical Exam:    VS:  BP (!) 152/90   Pulse 96   Ht 5\' 5"  (1.651 m)   Wt 179 lb 12.8 oz (81.6 kg)   LMP 09/20/2018   BMI 29.92 kg/m     Wt Readings from Last 3 Encounters:  10/09/18 179 lb 12.8 oz (81.6 kg)  10/05/18 180 lb (81.6 kg)     Constitutional: No acute distress Eyes: pupils equally round and reactive to light, sclera non-icteric, normal conjunctiva and lids ENMT: normal dentition, moist mucous membranes Cardiovascular: regular rhythm, normal rate, no murmurs. S1 and S2 normal. Radial pulses normal bilaterally. No jugular venous distention.  Respiratory: clear to auscultation bilaterally GI : normal bowel sounds, soft and nontender. No distention.   MSK: extremities warm, well perfused. No edema.  NEURO: grossly nonfocal exam, moves all extremities. PSYCH: alert and oriented x 3, normal mood and affect.      ASSESSMENT:    1. Palpitations   2. DOE (dyspnea on exertion)   3. Essential hypertension    PLAN:    We will obtain a Holter monitor for palpitations, and a blood pressure monitor to monitor daily blood pressures.  We will also obtain an echocardiogram to evaluate for structural and functional changes with shortness of breath on exertion.  I will see her back in 3 months when testing is complete.   Medication Adjustments/Labs and Tests Ordered: Current medicines are reviewed at length with the patient today.  Concerns regarding medicines are outlined above.  Orders Placed This Encounter  Procedures  . HOLTER MONITOR - 24 HOUR  . HOLTER MONITOR - 48 HOUR  . EKG 12-Lead  . ECHOCARDIOGRAM COMPLETE   No orders of the defined types were placed in this encounter.   Patient Instructions  Medication Instructions:  Your physician recommends that you continue on your current  medications as directed. Please refer to the Current Medication list given to you today.  If you need a refill on your cardiac medications before your next appointment, please call your pharmacy.   Lab work: None ordered If you have labs (blood work) drawn today and your tests are completely normal, you will receive your results only by: Marland Kitchen MyChart Message (if you have MyChart) OR . A paper copy in the mail If you have any lab test that is abnormal or we need to change your treatment, we will call you to review the results.  Testing/Procedures: Your physician has requested that you have an echocardiogram. Echocardiography is a  painless test that uses sound waves to create images of your heart. It provides your doctor with information about the size and shape of your heart and how well your heart's chambers and valves are working. This procedure takes approximately one hour. There are no restrictions for this procedure.  Your physician has recommended that you wear a holter monitor. Holter monitors are medical devices that record the heart's electrical activity. Doctors most often use these monitors to diagnose arrhythmias. Arrhythmias are problems with the speed or rhythm of the heartbeat. The monitor is a small, portable device. You can wear one while you do your normal daily activities. This is usually used to diagnose what is causing palpitations/syncope (passing out).  Your physician has recommended that you wear ambulatory BP monitor.    Follow-Up: At [facility], you and your health needs are our priority.  As part of our continuing mission to provide you with exceptional heart care, we have created designated Provider Care Teams.  These Care Teams include your primary Cardiologist (physician) and Advanced Practice Providers (APPs -  Physician Assistants and Nurse Practitioners) who all work together to provide you with the care you need, when you need it. You will need a follow up  appointment in 3 months.   Dr.Achirya or one of the following Advanced Practice Providers on your designated Care Team:   Rosaria Ferries, PA-C . Jory Sims, DNP, ANP  Any Other Special Instructions Will Be Listed Below (If Applicable).      Signed, Elouise Munroe, MD  [place]

## 2018-11-12 ENCOUNTER — Ambulatory Visit (INDEPENDENT_AMBULATORY_CARE_PROVIDER_SITE_OTHER): Payer: BC Managed Care – PPO

## 2018-11-12 ENCOUNTER — Other Ambulatory Visit: Payer: Self-pay

## 2018-11-12 ENCOUNTER — Encounter: Payer: Self-pay | Admitting: *Deleted

## 2018-11-12 ENCOUNTER — Ambulatory Visit (HOSPITAL_COMMUNITY): Payer: BC Managed Care – PPO | Attending: Thoracic Diseases

## 2018-11-12 DIAGNOSIS — R002 Palpitations: Secondary | ICD-10-CM | POA: Diagnosis not present

## 2018-11-12 DIAGNOSIS — I10 Essential (primary) hypertension: Secondary | ICD-10-CM

## 2018-11-12 DIAGNOSIS — R0609 Other forms of dyspnea: Secondary | ICD-10-CM | POA: Insufficient documentation

## 2018-11-12 NOTE — Progress Notes (Signed)
Patient ID: Breanna Wang, female   DOB: [birth year], 52 y.o.   MRN: [id number]  24 hour ambulatory blood pressure monitor applied using standard adult cuff.

## 2018-11-13 ENCOUNTER — Telehealth: Payer: Self-pay

## 2018-11-13 NOTE — Telephone Encounter (Signed)
-----   Message from Elouise Munroe, MD sent at 11/13/2018 10:17 AM EST ----- Normal pump function of the heart with decreased relaxation function. Normal valve function.  Shortness of breath may be contributed to by this impaired relaxation function of the heart. We should focus on treating hypertension as well as considering low dose diuretic.  I would recommend starting losartan 25 mg daily and plan for uptitration.   Could also consider adding lasix 20 mg daily or as needed for shortness of breath, and spironolactone 25 mg in the future for added BP control.  If she would like to discuss medication therapy we can arrange for sooner follow up.

## 2018-11-13 NOTE — Telephone Encounter (Signed)
Called to give pt echo results and Dr.Acharya's recommendation. [email or web address].

## 2018-11-13 NOTE — Telephone Encounter (Signed)
Follow up:   Patient call back concerning her results.  Please call patient.

## 2018-11-16 MED ORDER — LOSARTAN POTASSIUM 25 MG PO TABS: 25.0000 mg | ORAL_TABLET | Freq: Every day | ORAL | 3 refills | Status: DC

## 2018-11-16 NOTE — Telephone Encounter (Signed)
Left message to call back  

## 2018-11-16 NOTE — Telephone Encounter (Signed)
Spoke to patient, patient aware and verbalized understanding.  rx sent to pharmacy.  appt rescheduled for 1/18 with Dr. Margaretann Loveless

## 2018-12-07 ENCOUNTER — Encounter: Payer: Self-pay | Admitting: Internal Medicine

## 2018-12-07 ENCOUNTER — Ambulatory Visit: Payer: BC Managed Care – PPO | Admitting: Internal Medicine

## 2018-12-07 VITALS — BP 142/87 | HR 67 | Ht 65.0 in | Wt 183.4 lb

## 2018-12-07 DIAGNOSIS — R002 Palpitations: Secondary | ICD-10-CM | POA: Diagnosis not present

## 2018-12-07 DIAGNOSIS — G4719 Other hypersomnia: Secondary | ICD-10-CM

## 2018-12-07 DIAGNOSIS — I10 Essential (primary) hypertension: Secondary | ICD-10-CM

## 2018-12-07 DIAGNOSIS — R0683 Snoring: Secondary | ICD-10-CM | POA: Diagnosis not present

## 2018-12-07 DIAGNOSIS — I491 Atrial premature depolarization: Secondary | ICD-10-CM

## 2018-12-07 MED ORDER — LOSARTAN POTASSIUM 50 MG PO TABS: 50.0000 mg | ORAL_TABLET | Freq: Every day | ORAL | 3 refills | Status: DC

## 2018-12-07 NOTE — Patient Instructions (Signed)
Medication Instructions:  Your physician has recommended you make the following change in your medication:   INCREASE Losartan to 50mg  daily. An Rx has been sent to your pharmacy.  If you need a refill on your cardiac medications before your next appointment, please call your pharmacy.   Lab work: None ordered If you have labs (blood work) drawn today and your tests are completely normal, you will receive your results only by: Marland Kitchen MyChart Message (if you have MyChart) OR . A paper copy in the mail If you have any lab test that is abnormal or we need to change your treatment, we will call you to review the results.  Testing/Procedures: Your physician has recommended that you have a sleep study. This test records several body functions during sleep, including: brain activity, eye movement, oxygen and carbon dioxide blood levels, heart rate and rhythm, breathing rate and rhythm, the flow of air through your mouth and nose, snoring, body muscle movements, and chest and belly movement. (Our sleep coordinator will contact regarding the next steps)  Follow-Up: At [facility], you and your health needs are our priority.  As part of our continuing mission to provide you with exceptional heart care, we have created designated Provider Care Teams.  These Care Teams include your primary Cardiologist (physician) and Advanced Practice Providers (APPs -  Physician Assistants and Nurse Practitioners) who all work together to provide you with the care you need, when you need it. You will need a follow up appointment in 3 months.

## 2018-12-07 NOTE — Progress Notes (Signed)
Cardiology Office Note:    Date:  12/07/2018   ID:  Breanna Wang, DOB [birth year], MRN [id number]  PCP:  Sharilyn Sites, MD  Cardiologist:  Kate Sable, MD  Electrophysiologist:  None   Referring MD: Sharilyn Sites, MD   Follow-up hypertension and palpitations  History of Present Illness:    Breanna Wang is a 53 y.o. female with a hx of hypertension who presents today for follow-up of palpitations and hypertension.  We performed a 24-hour blood pressure monitor which showed mildly elevated blood pressures during the awake period.  Normal blood pressures while sleeping.  We also performed an echocardiogram which showed mild focal basal hypertrophy and grade 2 diastolic dysfunction indicating high left ventricular filling pressures.  With a history of hypertension and grade 2 diastolic dysfunction I recommended afterload reduction, and we started losartan 25 mg daily.  She has been tolerating this very well and feels that her blood pressures have improved.  She has excessive daytime sleepiness and fatigue, and endorses snoring.  Her Epworth Sleepiness Scale score is 17.  She has previously completed a home sleep assessment which showed borderline for sleep apnea and no action was taken.  She continues to endorse 2 hours of palpitations daily, but is not particularly bothered by this.  We discussed that these are likely arising from infrequent supraventricular ectopy as this correlates well on her 48-hour Holter monitor.  We discussed medical therapy for treatment of palpitations today.  The patient denies chest pain, chest pressure, dyspnea at rest or with exertion, PND, orthopnea, or leg swelling. Denies syncope or presyncope. Denies dizziness or lightheadedness.   Past Medical History:  Diagnosis Date  . Hypertension     Past Surgical History:  Procedure Laterality Date  . COLONOSCOPY N/A 05/09/2018   Procedure: COLONOSCOPY;  Surgeon: Rogene Houston, MD;   [facility];  Service: Endoscopy;  Laterality: N/A;  830  . DILATION AND CURETTAGE OF UTERUS    . POLYPECTOMY  05/09/2018   Procedure: POLYPECTOMY;  Surgeon: Rogene Houston, MD;  [facility];  Service: Endoscopy;;  colon  . TUBAL LIGATION    . uterine ablation      Current Medications: Current Meds  Medication Sig  . losartan (COZAAR) 50 MG tablet Take 1 tablet (50 mg total) by mouth daily.  Marland Kitchen omeprazole (PRILOSEC) 40 MG capsule Take 1 capsule by mouth daily.  . [DISCONTINUED] losartan (COZAAR) 25 MG tablet Take 1 tablet (25 mg total) by mouth daily.     Allergies:   Patient has no known allergies.   Social History   Socioeconomic History  . Marital status: Married    Spouse name: Not on file  . Number of children: Not on file  . Years of education: Not on file  . Highest education level: Not on file  Occupational History  . Not on file  Social Needs  . Financial resource strain: Not on file  . Food insecurity:    Worry: Not on file    Inability: Not on file  . Transportation needs:    Medical: Not on file    Non-medical: Not on file  Tobacco Use  . Smoking status: Former Research scientist (life sciences)  . Smokeless tobacco: Never Used  . Tobacco comment: quit at age 12  Substance and Sexual Activity  . Alcohol use: Yes    Comment: occasionally  . Drug use: No  . Sexual activity: Not on file  Lifestyle  . Physical activity:  Days per week: Not on file    Minutes per session: Not on file  . Stress: Not on file  Relationships  . Social connections:    Talks on phone: Not on file    Gets together: Not on file    Attends religious service: Not on file    Active member of club or organization: Not on file    Attends meetings of clubs or organizations: Not on file    Relationship status: Not on file  Other Topics Concern  . Not on file  Social History Narrative  . Not on file     Family History: The patient's family history includes Cancer in her paternal  grandmother; Diabetes in her father; High blood pressure in her brother, father, paternal grandfather, and paternal grandmother; Hypertension in her mother; Lung cancer in her father; Thyroid disease (age of onset: 28) in her mother.  ROS:   Please see the history of present illness.    All other systems reviewed and are negative.  EKGs/Labs/Other Studies Reviewed:    The following studies were reviewed today:  EKG: Not performed today  Recent Labs: 10/05/2018: ALT 13; BUN 11; Creatinine, Ser 0.41; Hemoglobin 13.7; Platelets 305; Potassium 4.0; Sodium 140  Recent Lipid Panel No results found for: CHOL, TRIG, HDL, CHOLHDL, VLDL, LDLCALC, LDLDIRECT  Physical Exam:    VS:  BP (!) 142/87   Pulse 67   Ht 5\' 5"  (1.651 m)   Wt 183 lb 6.4 oz (83.2 kg)   SpO2 100%   BMI 30.52 kg/m     Wt Readings from Last 3 Encounters:  12/07/18 183 lb 6.4 oz (83.2 kg)  10/09/18 179 lb 12.8 oz (81.6 kg)  10/05/18 180 lb (81.6 kg)     Constitutional: No acute distress Eyes: pupils equally round and reactive to light, sclera non-icteric, normal conjunctiva and lids ENMT: normal dentition, moist mucous membranes Cardiovascular: regular rhythm, normal rate, no murmurs. S1 and S2 normal. Radial pulses normal bilaterally. No jugular venous distention.  Respiratory: clear to auscultation bilaterally GI : normal bowel sounds, soft and nontender. No distention.   MSK: extremities warm, well perfused. No edema.  NEURO: grossly nonfocal exam, moves all extremities. PSYCH: alert and oriented x 3, normal mood and affect.   ASSESSMENT:    1. Essential hypertension   2. Snoring   3. Excessive daytime sleepiness   4. Palpitations   5. PAC (premature atrial contraction)    PLAN:    Hypertension- her blood pressures remained elevated and we will uptitrate the dose of losartan to 50 mg daily.  Will monitor for response.  Excessive daytime sleepiness- Epworth Sleepiness Scale score is 17, we have  participated in shared decision making and determined that pursuing a sleep study would be best.  She will follow this up.  Palpitations/PACs- PACs are infrequent but do correlate with symptoms.  I have offered her metoprolol succinate 25 mg daily for treatment of palpitations, however she would like to take less medication and will call us if she would like to start this medication in the future.  I am fine with no medical therapy at this time.  She may in fact have benefit from treatment of potential sleep apnea from the standpoint of PACs.  We will see her back in 3 months time to continue to address hypertension.  Medication Adjustments/Labs and Tests Ordered: Current medicines are reviewed at length with the patient today.  Concerns regarding medicines are outlined above.  Orders Placed  This Encounter  Procedures  . Split night study   Meds ordered this encounter  Medications  . losartan (COZAAR) 50 MG tablet    Sig: Take 1 tablet (50 mg total) by mouth daily.    Dispense:  90 tablet    Refill:  3    Patient Instructions  Medication Instructions:  Your physician has recommended you make the following change in your medication:   INCREASE Losartan to 50mg  daily. An Rx has been sent to your pharmacy.  If you need a refill on your cardiac medications before your next appointment, please call your pharmacy.   Lab work: None ordered If you have labs (blood work) drawn today and your tests are completely normal, you will receive your results only by: Marland Kitchen MyChart Message (if you have MyChart) OR . A paper copy in the mail If you have any lab test that is abnormal or we need to change your treatment, we will call you to review the results.  Testing/Procedures: Your physician has recommended that you have a sleep study. This test records several body functions during sleep, including: brain activity, eye movement, oxygen and carbon dioxide blood levels, heart rate and rhythm, breathing  rate and rhythm, the flow of air through your mouth and nose, snoring, body muscle movements, and chest and belly movement. (Our sleep coordinator will contact regarding the next steps)  Follow-Up: At [facility], you and your health needs are our priority.  As part of our continuing mission to provide you with exceptional heart care, we have created designated Provider Care Teams.  These Care Teams include your primary Cardiologist (physician) and Advanced Practice Providers (APPs -  Physician Assistants and Nurse Practitioners) who all work together to provide you with the care you need, when you need it. You will need a follow up appointment in 3 months.          Signed, Elouise Munroe, MD  12/07/2018 4:24 PM    [place]

## 2018-12-11 ENCOUNTER — Telehealth: Payer: Self-pay | Admitting: *Deleted

## 2018-12-11 NOTE — Telephone Encounter (Signed)
Left sleep study appointment details on Cell VM. ( ok per dpr)

## 2019-01-04 DIAGNOSIS — N951 Menopausal and female climacteric states: Secondary | ICD-10-CM | POA: Insufficient documentation

## 2019-01-04 DIAGNOSIS — R928 Other abnormal and inconclusive findings on diagnostic imaging of breast: Secondary | ICD-10-CM | POA: Insufficient documentation

## 2019-01-04 DIAGNOSIS — N898 Other specified noninflammatory disorders of vagina: Secondary | ICD-10-CM | POA: Insufficient documentation

## 2019-01-10 ENCOUNTER — Encounter (INDEPENDENT_AMBULATORY_CARE_PROVIDER_SITE_OTHER): Payer: Self-pay

## 2019-01-10 ENCOUNTER — Ambulatory Visit: Payer: BC Managed Care – PPO | Admitting: Internal Medicine

## 2019-01-10 ENCOUNTER — Encounter: Payer: Self-pay | Admitting: Internal Medicine

## 2019-01-10 VITALS — BP 142/89 | HR 89 | Ht 65.0 in | Wt 182.0 lb

## 2019-01-10 DIAGNOSIS — R002 Palpitations: Secondary | ICD-10-CM

## 2019-01-10 DIAGNOSIS — R0609 Other forms of dyspnea: Secondary | ICD-10-CM | POA: Diagnosis not present

## 2019-01-10 DIAGNOSIS — I491 Atrial premature depolarization: Secondary | ICD-10-CM

## 2019-01-10 DIAGNOSIS — I10 Essential (primary) hypertension: Secondary | ICD-10-CM

## 2019-01-10 NOTE — Progress Notes (Signed)
Cardiology Office Note:    Date:  01/10/2019   ID:  Breanna Wang, DOB 28-Feb-1966, MRN [id number]  PCP:  Sharilyn Sites, MD  Cardiologist:  Kate Sable, MD  Electrophysiologist:  None   Referring MD: Sharilyn Sites, MD   Hypertension f/u  History of Present Illness:    Breanna Wang is a 53 y.o. female with a hx of hypertension who presents for follow up of blood pressure and review of testing.   Echocardiogram, ambulatory blood pressure monitor, and 48-hour Holter monitor reviewed with the patient.  She shares with me that her blood pressures remain elevated at home, and on today's evaluation, her blood pressure is 142/90 in the left arm and 142/89 in the right arm.She has been taking losartan 50 mg daily as previously prescribed.  She is tolerating this medication well.  Her echocardiogram demonstrates a preserved ejection fraction with grade 2 diastolic dysfunction and a medial E/E prime of 12 suggesting indeterminate filling pressure.  There is mild increased thickness of the basal septum.  Her Holter monitor showed infrequent supraventricular ectopy likely contributing to the sensation of fluttering in her chest.  Her ambulatory blood pressure monitor demonstrated mildly elevated blood pressures while awake, and no significant hypertension while sleeping.  We have discussed that for a young and otherwise healthy person, it would be reasonable to have a goal blood pressure of 120/80 mmHg.  We are not at goal yet, and we will uptitrate losartan as our first attempt.  She is not having significant symptoms at this time.  She does intermittently report some fluttering in her chest, and we have discussed the etiology of this.  I have offered medical therapy for symptomatic palpitations, however she would like to monitor these prior to starting additional therapy.     Past Medical History:  Diagnosis Date  . Hypertension     Past Surgical History:  Procedure  Laterality Date  . COLONOSCOPY N/A 05/09/2018   Procedure: COLONOSCOPY;  Surgeon: Rogene Houston, MD;  Location: [facility];  Service: Endoscopy;  Laterality: N/A;  830  . DILATION AND CURETTAGE OF UTERUS    . POLYPECTOMY  05/09/2018   Procedure: POLYPECTOMY;  Surgeon: Rogene Houston, MD;  Location: [facility];  Service: Endoscopy;;  colon  . TUBAL LIGATION    . uterine ablation      Current Medications: No outpatient medications have been marked as taking for the 01/10/19 encounter (Office Visit) with Elouise Munroe, MD.     Allergies:   Patient has no known allergies.   Social History   Socioeconomic History  . Marital status: Married    Spouse name: Not on file  . Number of children: Not on file  . Years of education: Not on file  . Highest education level: Not on file  Occupational History  . Not on file  Social Needs  . Financial resource strain: Not on file  . Food insecurity:    Worry: Not on file    Inability: Not on file  . Transportation needs:    Medical: Not on file    Non-medical: Not on file  Tobacco Use  . Smoking status: Former Research scientist (life sciences)  . Smokeless tobacco: Never Used  . Tobacco comment: quit at age 57  Substance and Sexual Activity  . Alcohol use: Yes    Comment: occasionally  . Drug use: No  . Sexual activity: Not on file  Lifestyle  . Physical activity:    Days  per week: Not on file    Minutes per session: Not on file  . Stress: Not on file  Relationships  . Social connections:    Talks on phone: Not on file    Gets together: Not on file    Attends religious service: Not on file    Active member of club or organization: Not on file    Attends meetings of clubs or organizations: Not on file    Relationship status: Not on file  Other Topics Concern  . Not on file  Social History Narrative  . Not on file     Family History: The patient's family history includes Cancer in her paternal grandmother; Diabetes in her father; High  blood pressure in her brother, father, paternal grandfather, and paternal grandmother; Hypertension in her mother; Lung cancer in her father; Thyroid disease (age of onset: 61) in her mother.  ROS:   Please see the history of present illness.    All other systems reviewed and are negative.  EKGs/Labs/Other Studies Reviewed:    The following studies were reviewed today:  EKG: Not performed today  Recent Labs: 10/05/2018: ALT 13; BUN 11; Creatinine, Ser 0.41; Hemoglobin 13.7; Platelets 305; Potassium 4.0; Sodium 140  Recent Lipid Panel No results found for: CHOL, TRIG, HDL, CHOLHDL, VLDL, LDLCALC, LDLDIRECT  Physical Exam:    VS:  BP (!) 142/89   Pulse 89   Ht 5\' 5"  (1.651 m)   Wt 182 lb (82.6 kg)   BMI 30.29 kg/m     Wt Readings from Last 3 Encounters:  01/10/19 182 lb (82.6 kg)  12/07/18 183 lb 6.4 oz (83.2 kg)  10/09/18 179 lb 12.8 oz (81.6 kg)     Constitutional: No acute distress ENMT:  moist mucous membranes Cardiovascular: regular rhythm, normal rate, no murmurs. S1 and S2 normal. Radial pulses normal bilaterally. No jugular venous distention.  Respiratory: clear to auscultation bilaterally GI : normal bowel sounds, soft and nontender. No distention.   MSK: extremities warm, well perfused. No edema.  NEURO: grossly nonfocal exam, moves all extremities. PSYCH: alert and oriented x 3, normal mood and affect.    ASSESSMENT:    1. Essential hypertension   2. Palpitations   3. DOE (dyspnea on exertion)   4. PAC (premature atrial contraction)    PLAN:     We will plan to uptitrate losartan to 75 mg daily and monitor for response.  If blood pressure remains above goal, we will plan to uptitrate to 100 mg daily.  At that time if blood pressures are still above goal, we can consider the addition of a second agent such as amlodipine, or spironolactone with her evidence of diastolic dysfunction for additional blood pressure control.  We will be in contact by phone to  titrate these medications.  She has been encouraged to contact the office if she has any questions or concerns or any adverse symptoms from her medical therapy.  She has normal renal function.   Medication Adjustments/Labs and Tests Ordered: Current medicines are reviewed at length with the patient today.  Concerns regarding medicines are outlined above.  No orders of the defined types were placed in this encounter.  No orders of the defined types were placed in this encounter.   Patient Instructions  Medication Instructions:  Dr. has recommended you make the following change in your medication:  INCREASE Losartan to 75mg  daily  If you need a refill on your cardiac medications before your next appointment, please  call your pharmacy.   Lab work: None ordered  Testing/Procedures: None ordered  Follow-Up: At [facility], you and your health needs are our priority.  As part of our continuing mission to provide you with exceptional heart care, we have created designated Provider Care Teams.  These Care Teams include your primary Cardiologist (physician) and Advanced Practice Providers (APPs -  Physician Assistants and Nurse Practitioners) who all work together to provide you with the care you need, when you need it. You will need a follow up appointment in 5 months.  Please call our office 2 months in advance to schedule this appointment.  You may see  Dr. or one of the following Advanced Practice Providers on your designated Care Team:   Rosaria Ferries, PA-C . Jory Sims, DNP, ANP  Any Other Special Instructions Will Be Listed Below (If Applicable). Dr. has requested that you regularly monitor and record your blood pressure readings at home. Please use the same machine at the same time of day to check your readings and record them.   Your blood pressure goal <130/80. If your BP remains consistently elevated over the next 2 weeks increase Losartan to 100mg   daily        Signed, Elouise Munroe, MD  01/10/2019 10:41 AM    [place]

## 2019-01-10 NOTE — Patient Instructions (Signed)
Medication Instructions:  Dr.Acharya has recommended you make the following change in your medication:  INCREASE Losartan to 75mg  daily  If you need a refill on your cardiac medications before your next appointment, please call your pharmacy.   Lab work: None ordered  Testing/Procedures: None ordered  Follow-Up: At [facility], you and your health needs are our priority.  As part of our continuing mission to provide you with exceptional heart care, we have created designated Provider Care Teams.  These Care Teams include your primary Cardiologist (physician) and Advanced Practice Providers (APPs -  Physician Assistants and Nurse Practitioners) who all work together to provide you with the care you need, when you need it. You will need a follow up appointment in 5 months.  Please call our office 2 months in advance to schedule this appointment.  You may see  Dr.Acharya or one of the following Advanced Practice Providers on your designated Care Team:   Rosaria Ferries, PA-C . Jory Sims, DNP, ANP  Any Other Special Instructions Will Be Listed Below (If Applicable). Dr.Acharya has requested that you regularly monitor and record your blood pressure readings at home. Please use the same machine at the same time of day to check your readings and record them.   Your blood pressure goal <130/80. If your BP remains consistently elevated over the next 2 weeks increase Losartan to 100mg  daily

## 2019-01-22 ENCOUNTER — Telehealth: Payer: Self-pay

## 2019-01-22 NOTE — Telephone Encounter (Addendum)
Spoke with the patient. Patient sts that after the Losartan increase ger BP was still running high. 130's/90's. She increased her Losartan to 100mg  daily 3 days ago. Her BP is about the same. Adv her to continue the higher dose pf 100mg  daily. She should continue to monitor her BP daily. She will contact the office late this week or early next week to provide her BP readings. Pt sts that she will need a refill at that time if she is going to remain on the higher dose.

## 2019-01-22 NOTE — Telephone Encounter (Signed)
Patient Losartan was increased by Dr.Acharya on 01/10/19 to 75mg  daily. The patient was given the following instructions.  Your blood pressure goal <130/80. If your BP remains consistently elevated over the next 2 weeks increase Losartan to 100mg  daily  Contacted the patient for an update. lmtcb.

## 2019-02-07 ENCOUNTER — Telehealth: Payer: Self-pay | Admitting: Internal Medicine

## 2019-02-07 MED ORDER — AMLODIPINE BESYLATE 5 MG PO TABS: 5.0000 mg | ORAL_TABLET | Freq: Every day | ORAL | 11 refills | Status: DC

## 2019-02-07 NOTE — Telephone Encounter (Signed)
Spoke with the patient and made her aware of Dr.Acharya's recommendation below. Patient is agreeable with the plan. Rx sent to the pt pharmacy for Amlodipine 5mg  daily #30 R-11. Pt will contact the office in 2-3 wks to provide her BP readings for Dr.Acharya to review. Pt questions answered. Pt verbalized understanding and voiced appreciation for the assistance.

## 2019-02-07 NOTE — Telephone Encounter (Signed)
Blood pressures are reasonable, but goal is 120/80.   Let's start amlodipine 5 mg daily and monitor blood pressures. We can titrate as needed.   Please have her contact us in 2-3 weeks after starting amlodipine in addition to losartan 100 mg daily and see how it is working.   Amlodipine side effects : mild constipation, mild leg swelling.

## 2019-02-07 NOTE — Telephone Encounter (Signed)
Called the pt to give her Dr.Acharya' s recommendations below. lmtcb.

## 2019-02-07 NOTE — Telephone Encounter (Addendum)
Spoke with the pt. She has been taking the Losartan 100mg  daily for 2 weeks at the instruction of Dr.Acharya. She sts that the BP readings she provided reflect what her BP has been averaging. Adv her that BP readings are not to goal based on Dr.Acharya's recommendation of <130/80. Adv the pt that I will fwd the update to Dr.Acharya and we will call her back with her recommendation.

## 2019-02-07 NOTE — Telephone Encounter (Signed)
Pt c/o BP issue: STAT if pt c/o blurred vision, one-sided weakness or slurred speech  1. What are your last 5 BP readings? 133/95, 132/98, 130/87  2. Are you having any other symptoms (ex. Dizziness, headache, blurred vision, passed out)?no  3. What is your BP issue? High for her- Dr Margaretann Loveless had put her on 100 mg of Losartan and wanted to see how it worked for 2 weeks.

## 2019-03-11 ENCOUNTER — Other Ambulatory Visit: Payer: Self-pay

## 2019-03-11 ENCOUNTER — Telehealth: Payer: Self-pay | Admitting: Internal Medicine

## 2019-03-11 ENCOUNTER — Ambulatory Visit: Payer: BC Managed Care – PPO | Admitting: Internal Medicine

## 2019-03-11 MED ORDER — LOSARTAN POTASSIUM 100 MG PO TABS: 100.0000 mg | ORAL_TABLET | Freq: Every day | ORAL | 1 refills | Status: DC

## 2019-03-11 NOTE — Telephone Encounter (Signed)
New Message:   Patient concerning her medications. Please call patient.

## 2019-03-11 NOTE — Telephone Encounter (Signed)
Medication Losartan 100 mg called into pharmacy.

## 2019-03-22 ENCOUNTER — Telehealth: Payer: Self-pay | Admitting: Internal Medicine

## 2019-03-22 NOTE — Telephone Encounter (Signed)
lmsg to call back.  pateint needs appt in July. dc

## 2019-04-04 NOTE — Telephone Encounter (Signed)
5 month recall. LMTCB. Patient needs appt in July. Please schedule/dc

## 2019-06-27 ENCOUNTER — Telehealth: Payer: Self-pay | Admitting: Internal Medicine

## 2019-06-27 NOTE — Telephone Encounter (Signed)
LVM for patient to call and schedule office visit with another physician.

## 2019-07-04 ENCOUNTER — Ambulatory Visit: Payer: BC Managed Care – PPO | Admitting: Internal Medicine

## 2019-08-27 ENCOUNTER — Other Ambulatory Visit: Payer: Self-pay

## 2019-08-27 MED ORDER — LOSARTAN POTASSIUM 100 MG PO TABS: 100.0000 mg | ORAL_TABLET | Freq: Every day | ORAL | 0 refills | Status: DC

## 2019-12-17 ENCOUNTER — Ambulatory Visit (INDEPENDENT_AMBULATORY_CARE_PROVIDER_SITE_OTHER): Payer: BC Managed Care – PPO | Admitting: Otolaryngology

## 2019-12-19 NOTE — Progress Notes (Signed)
Cardiology Office Note:    Date:  12/20/2019   ID:  Breanna Wang, DOB 12-Jun-1966, MRN [id number]  PCP:  Sharilyn Sites, MD  Cardiologist:  Kate Sable, MD  Electrophysiologist:  None   Referring MD: Sharilyn Sites, MD   Chief Complaint: dizziness  History of Present Illness:    Breanna Wang is a 54 y.o. female with a hx of hypertension who presents today for follow-up in the setting of new dizziness.  She had COVID-19 infection around Thanksgiving and has had a protracted recovery.  She had loss of taste and smell for over 6 weeks.  She continues to have a relatively dry cough with chest congestion that is not productive of sputum.  She has had situational stressors.  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  She describes her dizziness as the room spinning, and she is affected by eye movements and change in position.  She denies chest pain, shortness of breath, palpitations.  Denies syncope.  She denies a sensation of the room going dark or feeling as if she is going to pass out.  She endorses snoring and daytime fatigue and needs a referral to a sleep study.  Her dizziness has not responded to vestibular maneuvers.  She has gone to a chiropractor to have these done.  She finds eye movements are the most provoking factor.  She had some swelling behind her right ear and she felt as if she must have a sinus infection with her symptoms.  She was prescribed an antibiotic and steroids last Saturday by her primary care provider with no significant improvement.  She wonders if her blood pressure is elevated and that is the source of her symptoms.  Past Medical History:  Diagnosis Date  . Hypertension     Past Surgical History:  Procedure Laterality Date  . COLONOSCOPY N/A 05/09/2018   Procedure: COLONOSCOPY;  Surgeon: Rogene Houston, MD;  Location: [facility];  Service: Endoscopy;  Laterality: N/A;  830  . DILATION AND CURETTAGE OF UTERUS    . POLYPECTOMY  05/09/2018   Procedure: POLYPECTOMY;  Surgeon: Rogene Houston, MD;  Location: [facility];  Service: Endoscopy;;  colon  . TUBAL LIGATION    . uterine ablation      Current Medications: Current Meds  Medication Sig  . amoxicillin (AMOXIL) 500 MG capsule   . losartan (COZAAR) 100 MG tablet Take 1 tablet (100 mg total) by mouth daily.  . [DISCONTINUED] amLODipine (NORVASC) 5 MG tablet Take 1 tablet (5 mg total) by mouth daily.     Allergies:   Patient has no known allergies.   Social History   Socioeconomic History  . Marital status: Married    Spouse name: Not on file  . Number of children: Not on file  . Years of education: Not on file  . Highest education level: Not on file  Occupational History  . Not on file  Tobacco Use  . Smoking status: Former Research scientist (life sciences)  . Smokeless tobacco: Never Used  . Tobacco comment: quit at age 59  Substance and Sexual Activity  . Alcohol use: Yes    Comment: occasionally  . Drug use: No  . Sexual activity: Not on file  Other Topics Concern  . Not on file  Social History Narrative  . Not on file   Social Determinants of Health   Financial  Resource Strain:   . Difficulty of Paying Living Expenses: Not on file  Food Insecurity:   . Worried About Charity fundraiser in the Last Year: Not on file  . Ran Out of Food in the Last Year: Not on file  Transportation Needs:   . Lack of Transportation (Medical): Not on file  . Lack of Transportation (Non-Medical): Not on file  Physical Activity:   . Days of Exercise per Week: Not on file  . Minutes of Exercise per Session: Not on file  Stress:   . Feeling of Stress : Not on file  Social Connections:   . Frequency of Communication with Friends and Family: Not on file  . Frequency of Social Gatherings with Friends and Family: Not on file  . Attends Religious Services: Not on file  .  Active Member of Clubs or Organizations: Not on file  . Attends Archivist Meetings: Not on file  . Marital Status: Not on file     Family History: The patient's family history includes Cancer in her paternal grandmother; Diabetes in her father; High blood pressure in her brother, father, paternal grandfather, and paternal grandmother; Hypertension in her mother; Lung cancer in her father; Thyroid disease (age of onset: 22) in her mother.  ROS:   Please see the history of present illness.    All other systems reviewed and are negative.  EKGs/Labs/Other Studies Reviewed:    The following studies were reviewed today:  EKG:  NSR  Recent Labs: No results found for requested labs within last 8760 hours.  Recent Lipid Panel No results found for: CHOL, TRIG, HDL, CHOLHDL, VLDL, LDLCALC, LDLDIRECT  Physical Exam:    VS:  BP 139/84   Pulse 84   Temp (!) 96.4 F (35.8 C)   Ht 5\' 5"  (1.651 m)   Wt 192 lb 3.2 oz (87.2 kg)   SpO2 99%   BMI 31.98 kg/m     Wt Readings from Last 5 Encounters:  12/20/19 192 lb 3.2 oz (87.2 kg)  01/10/19 182 lb (82.6 kg)  12/07/18 183 lb 6.4 oz (83.2 kg)  10/09/18 179 lb 12.8 oz (81.6 kg)  10/05/18 180 lb (81.6 kg)     Constitutional: No acute distress Eyes: sclera non-icteric, normal conjunctiva and lids ENMT: normal dentition, moist mucous membranes Cardiovascular: regular rhythm, normal rate, no murmurs. S1 and S2 normal. Radial pulses normal bilaterally. No jugular venous distention.  Respiratory: clear to auscultation bilaterally GI : normal bowel sounds, soft and nontender. No distention.   MSK: extremities warm, well perfused. No edema.  NEURO: grossly nonfocal exam, moves all extremities. PSYCH: alert and oriented x 3, normal mood and affect.   ASSESSMENT:    1. Dizziness   2. Essential hypertension   3. Palpitations   4. Snoring   5. Excessive daytime sleepiness    PLAN:    Dizziness-sounds most consistent with vertigo  based on her symptoms.  Her blood pressure has gotten elevated to the 123456 systolic and she wonders if this is contributing to her dizziness.  She does not find that she has increased dizziness with hypertension, so it is unlikely a contributor.  In addition it does not seem like she is having malignant arrhythmia contributing to her symptoms.  We will monitor her blood pressure closely and I have asked her to take it at home.  She brought in her home blood pressure cuff for calibration today.  Follow-up with primary care to discuss vestibular maneuvers  and further work-up of vertigo.  Hypertension-I will increase her amlodipine so that we can get her blood pressure is close to goal as possible, her goal is 120/80.  Increase amlodipine to 10 mg daily and continue losartan 100 mg daily.    Palpitations-resolved  Dyspnea-not an active symptom.  Snoring-needs a referral for sleep study, daytime fatigue.   Total time of encounter: 40 minutes total time of encounter, including 30 minutes spent in face-to-face patient care. This time includes coordination of care and counseling regarding evaluation of vertigo and management of hypertension. Remainder of non-face-to-face time involved reviewing chart documents/testing relevant to the patient encounter and documentation in the medical record.  Cherlynn Kaiser, MD [place]  [facility]   Medication Adjustments/Labs and Tests Ordered: Current medicines are reviewed at length with the patient today.  Concerns regarding medicines are outlined above.  No orders of the defined types were placed in this encounter.  No orders of the defined types were placed in this encounter.   Patient Instructions  Medication Instructions:   increase dose of Amlodipine to 10 mg daily  No other changes   *If you need a refill on your cardiac medications before your next appointment, please call your pharmacy*  Lab Work: Not needed  Testing/Procedures:   will schedule to [place] physician has recommended that you have a sleep study. This test records several body functions during sleep, including: brain activity, eye movement, oxygen and carbon dioxide blood levels, heart rate and rhythm, breathing rate and rhythm, the flow of air through your mouth and nose, snoring, body muscle movements, and chest and belly movement.   Follow-Up: At [facility], you and your health needs are our priority.  As part of our continuing mission to provide you with exceptional heart care, we have created designated Provider Care Teams.  These Care Teams include your primary Cardiologist (physician) and Advanced Practice Providers (APPs -  Physician Assistants and Nurse Practitioners) who all work together to provide you with the care you need, when you need it.  Your next appointment:   6 month(s)  The format for your next appointment:   In Person  Provider:   Cherlynn Kaiser, MD  Other Instructions  keep hydrated    Sleep Studies A sleep study (polysomnogram) is a series of tests done while you are sleeping. A sleep study records your brain waves, heart rate, breathing rate, oxygen level, and eye and leg movements. A sleep study helps your health care provider:  See how well you sleep.  Diagnose a sleep disorder.  Determine how severe your sleep disorder is.  Create a plan to treat your sleep disorder. Your health care provider may recommend a sleep study if you:  Feel sleepy on most days.  Snore loudly while sleeping.  Have unusual behaviors while you sleep, such as walking.  Have brief periods in which you stop breathing during sleep (sleepapnea).  Fall asleep suddenly during the day (narcolepsy).  Have trouble falling asleep or staying asleep (insomnia).  Feel like you need to move your legs when trying to fall asleep (restless legs syndrome).  Move your legs by flexing and extending them regularly while  asleep (periodic limb movement disorder).  Act out your dreams while you sleep (sleep behavior disorder).  Feel like you cannot move when you first wake up (sleep paralysis). What tests are part of a sleep study? Most sleep studies record the following during sleep:  Brain activity.  Eye movements.  Heart rate and rhythm.  Breathing rate and rhythm.  Blood-oxygen level.  Blood pressure.  Chest and belly movement as you breathe.  Arm and leg movements.  Snoring or other noises.  Body position. Where are sleep studies done? Sleep studies are done at sleep centers. A sleep center may be inside a hospital, office, or clinic. The room where you have the study may look like a hospital room or a hotel room. The health care providers doing the study may come in and out of the room during the study. Most of the time, they will be in another room monitoring your test as you sleep. How are sleep studies done? Most sleep studies are done during a normal period of time for a full night of sleep. You will arrive at the study center in the evening and go home in the morning. Before the test  Bring your pajamas and toothbrush with you to the sleep study.  Do not have caffeine on the day of your sleep study.  Do not drink alcohol on the day of your sleep study.  Your health care provider will let you know if you should stop taking any of your regular medicines before the test. During the test      Round, sticky patches with sensors attached to recording wires (electrodes) are placed on your scalp, face, chest, and limbs.  Wires from all the electrodes and sensors run from your bed to a computer. The wires can be taken off and put back on if you need to get out of bed to go to the bathroom.  A sensor is placed over your nose to measure airflow.  A finger clip is put on your finger or ear to measure your blood oxygen level (pulse oximetry).  A belt is placed around your belly  and a belt is placed around your chest to measure breathing movements.  If you have signs of the sleep disorder called sleep apnea during your test, you may get a treatment mask to wear for the second half of the night. ? The mask provides positive airway pressure (PAP) to help you breathe better during sleep. This may greatly improve your sleep apnea. ? You will then have all tests done again with the mask in place to see if your measurements and recordings change. After the test  A medical doctor who specializes in sleep will evaluate the results of your sleep study and share them with you and your primary health care provider.  Based on your results, your medical history, and a physical exam, you may be diagnosed with a sleep disorder, such as: ? Sleep apnea. ? Restless legs syndrome. ? Sleep-related behavior disorder. ? Sleep-related movement disorders. ? Sleep-related seizure disorders.  Your health care team will help determine your treatment options based on your diagnosis. This may include: ? Improving your sleep habits (sleep hygiene). ? Wearing a continuous positive airway pressure (CPAP) or bi-level positive airway pressure (BPAP) mask. ? Wearing an oral device at night to improve breathing and reduce snoring. ? Taking medicines. Follow these instructions at home:  Take over-the-counter and prescription medicines only as told by your health care provider.  If you are instructed to use a CPAP or BPAP mask, make sure you use it nightly as directed.  Make any lifestyle changes that your health care provider recommends.  If you were given a device to open your airway while you sleep, use it only as told by your  health care provider.  Do not use any tobacco products, such as cigarettes, chewing tobacco, and e-cigarettes. If you need help quitting, ask your health care provider.  Keep all follow-up visits as told by your health care provider. This is important. Summary  A  sleep study (polysomnogram) is a series of tests done while you are sleeping. It shows how well you sleep.  Most sleep studies are done over one full night of sleep. You will arrive at the study center in the evening and go home in the morning.  If you have signs of the sleep disorder called sleep apnea during your test, you may get a treatment mask to wear for the second half of the night.  A medical doctor who specializes in sleep will evaluate the results of your sleep study and share them with your primary health care provider. This information is not intended to replace advice given to you by your health care provider. Make sure you discuss any questions you have with your health care provider. Document Revised: 04/24/2019 Document Reviewed: 12/05/2017 Elsevier Patient Education  [place].

## 2019-12-20 ENCOUNTER — Ambulatory Visit: Payer: BC Managed Care – PPO | Admitting: Internal Medicine

## 2019-12-20 ENCOUNTER — Other Ambulatory Visit: Payer: Self-pay

## 2019-12-20 ENCOUNTER — Encounter: Payer: Self-pay | Admitting: Internal Medicine

## 2019-12-20 ENCOUNTER — Other Ambulatory Visit (HOSPITAL_BASED_OUTPATIENT_CLINIC_OR_DEPARTMENT_OTHER): Payer: Self-pay

## 2019-12-20 ENCOUNTER — Telehealth: Payer: Self-pay | Admitting: *Deleted

## 2019-12-20 VITALS — BP 139/84 | HR 84 | Temp 96.4°F | Ht 65.0 in | Wt 192.2 lb

## 2019-12-20 DIAGNOSIS — G4719 Other hypersomnia: Secondary | ICD-10-CM

## 2019-12-20 DIAGNOSIS — R0683 Snoring: Secondary | ICD-10-CM

## 2019-12-20 DIAGNOSIS — R42 Dizziness and giddiness: Secondary | ICD-10-CM

## 2019-12-20 DIAGNOSIS — R002 Palpitations: Secondary | ICD-10-CM

## 2019-12-20 DIAGNOSIS — I10 Essential (primary) hypertension: Secondary | ICD-10-CM

## 2019-12-20 MED ORDER — AMLODIPINE BESYLATE 10 MG PO TABS: 10.0000 mg | ORAL_TABLET | Freq: Every day | ORAL | 3 refills | Status: DC

## 2019-12-20 NOTE — Patient Instructions (Addendum)
Medication Instructions:   increase dose of Amlodipine to 10 mg daily  No other changes   *If you need a refill on your cardiac medications before your next appointment, please call your pharmacy*  Lab Work: Not needed  Testing/Procedures:  will schedule to [place] physician has recommended that you have a sleep study. This test records several body functions during sleep, including: brain activity, eye movement, oxygen and carbon dioxide blood levels, heart rate and rhythm, breathing rate and rhythm, the flow of air through your mouth and nose, snoring, body muscle movements, and chest and belly movement.   Follow-Up: At [facility], you and your health needs are our priority.  As part of our continuing mission to provide you with exceptional heart care, we have created designated Provider Care Teams.  These Care Teams include your primary Cardiologist (physician) and Advanced Practice Providers (APPs -  Physician Assistants and Nurse Practitioners) who all work together to provide you with the care you need, when you need it.  Your next appointment:   6 month(s)  The format for your next appointment:   In Person  Provider:   Cherlynn Kaiser, MD  Other Instructions  keep hydrated    Sleep Studies A sleep study (polysomnogram) is a series of tests done while you are sleeping. A sleep study records your brain waves, heart rate, breathing rate, oxygen level, and eye and leg movements. A sleep study helps your health care provider:  See how well you sleep.  Diagnose a sleep disorder.  Determine how severe your sleep disorder is.  Create a plan to treat your sleep disorder. Your health care provider may recommend a sleep study if you:  Feel sleepy on most days.  Snore loudly while sleeping.  Have unusual behaviors while you sleep, such as walking.  Have brief periods in which you stop breathing during sleep (sleepapnea).  Fall asleep suddenly  during the day (narcolepsy).  Have trouble falling asleep or staying asleep (insomnia).  Feel like you need to move your legs when trying to fall asleep (restless legs syndrome).  Move your legs by flexing and extending them regularly while asleep (periodic limb movement disorder).  Act out your dreams while you sleep (sleep behavior disorder).  Feel like you cannot move when you first wake up (sleep paralysis). What tests are part of a sleep study? Most sleep studies record the following during sleep:  Brain activity.     Eye movements.  Heart rate and rhythm.  Breathing rate and rhythm.  Blood-oxygen level.  Blood pressure.  Chest and belly movement as you breathe.  Arm and leg movements.  Snoring or other noises.  Body position. Where are sleep studies done? Sleep studies are done at sleep centers. A sleep center may be inside a hospital, office, or clinic. The room where you have the study may look like a hospital room or a hotel room. The health care providers doing the study may come in and out of the room during the study. Most of the time, they will be in another room monitoring your test as you sleep. How are sleep studies done? Most sleep studies are done during a normal period of time for a full night of sleep. You will arrive at the study center in the evening and go home in the morning. Before the test  Bring your pajamas and toothbrush with you to the sleep study.  Do not have caffeine on the day of your  sleep study.  Do not drink alcohol on the day of your sleep study.  Your health care provider will let you know if you should stop taking any of your regular medicines before the test. During the test      Round, sticky patches with sensors attached to recording wires (electrodes) are placed on your scalp, face, chest, and limbs.  Wires from all the electrodes and sensors run from your bed to a computer. The wires can be taken off and put back on  if you need to get out of bed to go to the bathroom.  A sensor is placed over your nose to measure airflow.  A finger clip is put on your finger or ear to measure your blood oxygen level (pulse oximetry).  A belt is placed around your belly and a belt is placed around your chest to measure breathing movements.  If you have signs of the sleep disorder called sleep apnea during your test, you may get a treatment mask to wear for the second half of the night. ? The mask provides positive airway pressure (PAP) to help you breathe better during sleep. This may greatly improve your sleep apnea. ? You will then have all tests done again with the mask in place to see if your measurements and recordings change. After the test  A medical doctor who specializes in sleep will evaluate the results of your sleep study and share them with you and your primary health care provider.  Based on your results, your medical history, and a physical exam, you may be diagnosed with a sleep disorder, such as: ? Sleep apnea. ? Restless legs syndrome. ? Sleep-related behavior disorder. ? Sleep-related movement disorders. ? Sleep-related seizure disorders.  Your health care team will help determine your treatment options based on your diagnosis. This may include: ? Improving your sleep habits (sleep hygiene). ? Wearing a continuous positive airway pressure (CPAP) or bi-level positive airway pressure (BPAP) mask. ? Wearing an oral device at night to improve breathing and reduce snoring. ? Taking medicines. Follow these instructions at home:  Take over-the-counter and prescription medicines only as told by your health care provider.  If you are instructed to use a CPAP or BPAP mask, make sure you use it nightly as directed.  Make any lifestyle changes that your health care provider recommends.  If you were given a device to open your airway while you sleep, use it only as told by your health care provider.  Do  not use any tobacco products, such as cigarettes, chewing tobacco, and e-cigarettes. If you need help quitting, ask your health care provider.  Keep all follow-up visits as told by your health care provider. This is important. Summary  A sleep study (polysomnogram) is a series of tests done while you are sleeping. It shows how well you sleep.  Most sleep studies are done over one full night of sleep. You will arrive at the study center in the evening and go home in the morning.  If you have signs of the sleep disorder called sleep apnea during your test, you may get a treatment mask to wear for the second half of the night.  A medical doctor who specializes in sleep will evaluate the results of your sleep study and share them with your primary health care provider. This information is not intended to replace advice given to you by your health care provider. Make sure you discuss any questions you have with your health care  provider. Document Revised: 04/24/2019 Document Reviewed: 12/05/2017 Elsevier Patient Education  [place].

## 2019-12-20 NOTE — Telephone Encounter (Signed)
Patient left me a message she returned my call. Called her again got voicemail. Left sleep study and COVID appointment details on VM. Also provided sleep center's contact information if she needs to reschedule appointment or has additional questions.

## 2019-12-20 NOTE — Telephone Encounter (Signed)
Left message to return a call to discuss sleep study appointment details. 

## 2019-12-23 ENCOUNTER — Telehealth: Payer: Self-pay | Admitting: *Deleted

## 2019-12-23 ENCOUNTER — Other Ambulatory Visit: Payer: Self-pay | Admitting: *Deleted

## 2019-12-23 MED ORDER — LOSARTAN POTASSIUM 100 MG PO TABS: 100.0000 mg | ORAL_TABLET | Freq: Every day | ORAL | 5 refills | Status: DC

## 2019-12-23 NOTE — Telephone Encounter (Signed)
Patient called in and left a message to cancel her sleep study and COVID appointments.due to work she cannot quarantine. She will check her work schedule and call back to reschedule these appointments.

## 2019-12-23 NOTE — Telephone Encounter (Signed)
Left sleep study and COVID appointment details on cell VM.

## 2019-12-23 NOTE — Telephone Encounter (Signed)
-----   Message from Raiford Simmonds, RN sent at 12/20/2019  8:57 AM EST ----- Regarding: need schedule for split night Saw dr Margaretann Loveless today   Ordered  split night study   Dx snoring , daytime sleepiness   Thanks Michele Rockers

## 2019-12-27 ENCOUNTER — Other Ambulatory Visit (HOSPITAL_COMMUNITY): Payer: BC Managed Care – PPO

## 2020-01-14 ENCOUNTER — Other Ambulatory Visit: Payer: Self-pay | Admitting: Otolaryngology

## 2020-01-14 DIAGNOSIS — R94121 Abnormal vestibular function study: Secondary | ICD-10-CM

## 2020-01-28 ENCOUNTER — Other Ambulatory Visit: Payer: Self-pay

## 2020-01-28 ENCOUNTER — Ambulatory Visit
Admission: RE | Admit: 2020-01-28 | Discharge: 2020-01-28 | Disposition: A | Discharge status: Home / Self Care | Payer: BC Managed Care – PPO | Source: Ambulatory Visit | Attending: Otolaryngology | Admitting: Otolaryngology

## 2020-01-28 DIAGNOSIS — R94121 Abnormal vestibular function study: Secondary | ICD-10-CM

## 2020-01-28 MED ORDER — GADOBENATE DIMEGLUMINE 529 MG/ML IV SOLN
17.0000 mL | Freq: Once | INTRAVENOUS | Status: AC | PRN
  Administered 2020-01-28: 17 mL via INTRAVENOUS

## 2020-02-13 ENCOUNTER — Telehealth (INDEPENDENT_AMBULATORY_CARE_PROVIDER_SITE_OTHER): Payer: BC Managed Care – PPO | Admitting: Cardiology

## 2020-02-13 ENCOUNTER — Encounter: Payer: Self-pay | Admitting: Cardiology

## 2020-02-13 VITALS — Ht 65.0 in | Wt 192.0 lb

## 2020-02-13 DIAGNOSIS — I10 Essential (primary) hypertension: Secondary | ICD-10-CM

## 2020-02-13 DIAGNOSIS — R42 Dizziness and giddiness: Secondary | ICD-10-CM | POA: Diagnosis not present

## 2020-02-13 DIAGNOSIS — R002 Palpitations: Secondary | ICD-10-CM

## 2020-02-13 MED ORDER — AMLODIPINE BESYLATE 5 MG PO TABS: 5.0000 mg | ORAL_TABLET | Freq: Every day | ORAL | 1 refills | Status: DC

## 2020-02-13 NOTE — Progress Notes (Signed)
Virtual Visit via Video Note   This visit type was conducted due to national recommendations for restrictions regarding the COVID-19 Pandemic (e.g. social distancing) in an effort to limit this patient's exposure and mitigate transmission in our community.  Due to her co-morbid illnesses, this patient is at least at moderate risk for complications without adequate follow up.  This format is felt to be most appropriate for this patient at this time.  All issues noted in this document were discussed and addressed.  A limited physical exam was performed with this format.  Please refer to the patient's chart for her consent to telehealth for [facility].   The patient was identified using 2 identifiers.  Date:  02/13/2020   ID:  Breanna Wang, DOB [birth year], MRN [id number]  Patient Location: Home Provider Location: Home  PCP:  Sharilyn Sites, MD  Cardiologist:  Elouise Munroe, MD  Electrophysiologist:  None   Evaluation Performed:  Follow-Up Visit  Chief Complaint:  edema  History of Present Illness:    Breanna Wang is a 54 y.o. female with a history of hypertension and past history of palpitations.  In 2019 she had a echocardiogram for palpitations and dyspnea, this was essentially normal.  She has had a Holter monitor that showed no significant arrhythmia.  She most recently saw Dr. Ainsley Spinner on December 20, 2019.  The patient had contracted Covid in November 2020.  She had somewhat of a protracted course.  Since then she has had persistent dizziness.  Her blood pressures also been elevated.  Dr. Margaretann Loveless suggested increasing her amlodipine to 10 mg daily.  Since then the patient has continued her work-up for persistent dizziness.  She had a brain MRI on 01/28/2020.  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  The patient contacted the office recently with complaints of increasing edema in her hands and feet.  She stopped her amlodipine for a day and her edema improved.  She continues to have generalized fatigue.  Her blood pressure when she went to see the neurosurgeon was 144/88.  The patient does not have symptoms concerning for COVID-19 infection (fever, chills, cough, or new shortness of breath).    Past Medical History:  Diagnosis Date  . Hypertension    Past Surgical History:  Procedure Laterality Date  . COLONOSCOPY N/A 05/09/2018   Procedure: COLONOSCOPY;  Surgeon: Rogene Houston, MD;  [facility];  Service: Endoscopy;  Laterality: N/A;  830  . DILATION AND CURETTAGE OF UTERUS    . POLYPECTOMY  05/09/2018   Procedure: POLYPECTOMY;  Surgeon: Rogene Houston, MD;  [facility];  Service: Endoscopy;;  colon  . TUBAL LIGATION    . uterine ablation       Current Meds  Medication Sig  . amLODipine (NORVASC) 10 MG tablet Take 1 tablet (10 mg total) by mouth daily.  Marland Kitchen losartan (COZAAR) 100 MG tablet Take 1 tablet (100 mg total) by mouth daily.     Allergies:   Patient has no known allergies.   Social History   Tobacco Use  . Smoking status: Former Research scientist (life sciences)  . Smokeless tobacco: Never Used  . Tobacco comment: quit at age 65  Substance Use Topics  . Alcohol use: Yes    Comment:  occasionally  . Drug use: No     Family Hx: The patient's family history includes Cancer in her paternal grandmother; Diabetes in her father; High blood pressure in her brother, father, paternal grandfather, and paternal grandmother; Hypertension in her mother; Lung cancer in her father; Thyroid disease (age of onset: 75) in her mother.  ROS:   Please see the history of present illness.    All other systems reviewed and are negative.   Prior CV studies:   The following studies were reviewed today: MRI 26-Feb-2020 Echo  2019  Labs/Other Tests and Data Reviewed:    EKG:  An ECG dated 12/20/2019 was personally reviewed today and demonstrated:  NSR- HR 84  Recent Labs: No results found for requested labs within last 8760 hours.   Recent Lipid Panel No results found for: CHOL, TRIG, HDL, CHOLHDL, LDLCALC, LDLDIRECT  Wt Readings from Last 3 Encounters:  02/13/20 192 lb (87.1 kg)  12/20/19 192 lb 3.2 oz (87.2 kg)  01/10/19 182 lb (82.6 kg)     Objective:    Vital Signs:  Ht 5\' 5"  (1.651 m)   Wt 192 lb (87.1 kg)   BMI 31.95 kg/m    VITAL SIGNS:  reviewed  ASSESSMENT & PLAN:    HTN- Some edema with increased Amlodipine  Dizziness- Probably secondary to COVID Nov 2020  Suspected sleep apnea- She did not follow through with sleep study scheduling  Abnormal MRI- She tells me she was evaluated by a neurosurgeon and reassured the anomaly was not contributing to her symptoms and did not need further work up. No records in Epic regarding this.   Plan: reduce Amlodipine to 5 mg daily.  Add HCTZ 12.5 mg to her current medications.  Monitor B/P- virtual f/u with me in two weeks- check BMP then.   COVID-19 Education: The signs and symptoms of COVID-19 were discussed with the patient and how to seek care for testing (follow up with PCP or arrange E-visit).  The importance of social distancing was discussed today.  Time:   Today, I have spent 15 minutes with the patient with telehealth technology discussing the above problems.     Medication Adjustments/Labs and Tests Ordered: Current medicines are reviewed at length with the patient today.  Concerns regarding medicines are outlined above.   Tests Ordered: No orders of the defined types were placed in this encounter.   Medication Changes: No orders of the defined types were placed in this encounter.   Follow Up:   Virtual with me two weeks  Signed, Kerin Ransom, PA-C  02/13/2020 8:30 AM    [place]

## 2020-02-13 NOTE — Addendum Note (Signed)
Addended by: Therisa Doyne on: 02/13/2020 03:42 PM   Modules accepted: Orders

## 2020-02-13 NOTE — Patient Instructions (Addendum)
Medication Instructions:   Decrease Amlodipine to 5 mg daily.  START Hydrochlorothiazide 12.5 mg daily.  *If you need a refill on your cardiac medications before your next appointment, please call your pharmacy*   Lab Work: Your physician recommends that you return for lab work in 2 weeks: BMET. You do not need an appointment to have blood work done in our office. Our lab is open from 8 AM- 4PM, closed for lunch from 12:45-1:45 PM.  If you have labs (blood work) drawn today and your tests are completely normal, you will receive your results only by: Marland Kitchen MyChart Message (if you have MyChart) OR . A paper copy in the mail If you have any lab test that is abnormal or we need to change your treatment, we will call you to review the results.   Follow-Up: At [facility], you and your health needs are our priority.  As part of our continuing mission to provide you with exceptional heart care, we have created designated Provider Care Teams.  These Care Teams include your primary Cardiologist (physician) and Advanced Practice Providers (APPs -  Physician Assistants and Nurse Practitioners) who all work together to provide you with the care you need, when you need it.  We recommend signing up for the patient portal called "MyChart".  Sign up information is provided on this After Visit Summary.  MyChart is used to connect with patients for Virtual Visits (Telemedicine).  Patients are able to view lab/test results, encounter notes, upcoming appointments, etc.  Non-urgent messages can be sent to your provider as well.   To learn more about what you can do with MyChart, go to [email or web address].    Your next appointment:   Monday 03/02/20 at 8:15 AM  The format for your next appointment:   Virtual Visit   Provider:   Kerin Ransom, PA-C   Other Instructions Your physician has requested that you regularly monitor and record your blood pressure readings at home, at least five times weekly.  Please use the same machine at the same time of day to check your readings and record them to bring to your follow-up visit.

## 2020-02-14 MED ORDER — FUROSEMIDE 20 MG PO TABS: 20.0000 mg | ORAL_TABLET | Freq: Every day | ORAL | 11 refills | Status: DC

## 2020-02-24 ENCOUNTER — Other Ambulatory Visit: Payer: Self-pay

## 2020-02-24 ENCOUNTER — Other Ambulatory Visit (HOSPITAL_COMMUNITY)
Admission: RE | Admit: 2020-02-24 | Discharge: 2020-02-24 | Disposition: A | Discharge status: Home / Self Care | Payer: BC Managed Care – PPO | Source: Ambulatory Visit | Attending: Cardiovascular Disease | Admitting: Cardiovascular Disease

## 2020-02-24 DIAGNOSIS — Z01812 Encounter for preprocedural laboratory examination: Secondary | ICD-10-CM | POA: Insufficient documentation

## 2020-02-24 DIAGNOSIS — Z20822 Contact with and (suspected) exposure to covid-19: Secondary | ICD-10-CM | POA: Diagnosis not present

## 2020-02-24 LAB — SARS CORONAVIRUS 2 (TAT 6-24 HRS): SARS Coronavirus 2: NEGATIVE

## 2020-02-26 ENCOUNTER — Other Ambulatory Visit: Payer: Self-pay

## 2020-02-26 ENCOUNTER — Ambulatory Visit: Payer: BC Managed Care – PPO | Attending: Internal Medicine | Admitting: Cardiovascular Disease

## 2020-02-26 DIAGNOSIS — Z79899 Other long term (current) drug therapy: Secondary | ICD-10-CM | POA: Insufficient documentation

## 2020-02-26 DIAGNOSIS — R0683 Snoring: Secondary | ICD-10-CM

## 2020-02-26 DIAGNOSIS — R002 Palpitations: Secondary | ICD-10-CM

## 2020-02-26 DIAGNOSIS — G4719 Other hypersomnia: Secondary | ICD-10-CM

## 2020-02-26 DIAGNOSIS — G4736 Sleep related hypoventilation in conditions classified elsewhere: Secondary | ICD-10-CM | POA: Diagnosis present

## 2020-02-26 DIAGNOSIS — R0902 Hypoxemia: Secondary | ICD-10-CM | POA: Diagnosis not present

## 2020-02-26 DIAGNOSIS — R42 Dizziness and giddiness: Secondary | ICD-10-CM

## 2020-03-02 ENCOUNTER — Telehealth: Payer: BC Managed Care – PPO | Admitting: Cardiology

## 2020-03-03 ENCOUNTER — Encounter: Payer: Self-pay | Admitting: Cardiovascular Disease

## 2020-03-03 NOTE — Procedures (Signed)
[facility]        Patient Name: Chrystian, Carrothers Date: 02/26/2020 Gender: Female D.O.B: [birth year] Age (years): 73 Referring Provider: Cherlynn Kaiser MD Height (inches): 65 Interpreting Physician: Shelva Majestic MD, ABSM Weight (lbs): 192 RPSGT: Peak, Robert BMI: 32 MRN: [id number] Neck Size: 15.50  CLINICAL INFORMATION Sleep Study Type: NPSG  Indication for sleep study: Excessive Daytime Sleepiness  Epworth Sleepiness Score: 17  SLEEP STUDY TECHNIQUE As per the AASM Manual for the Scoring of Sleep and Associated Events v2.3 (April 2016) with a hypopnea requiring 4% desaturations.  The channels recorded and monitored were frontal, central and occipital EEG, electrooculogram (EOG), submentalis EMG (chin), nasal and oral airflow, thoracic and abdominal wall motion, anterior tibialis EMG, snore microphone, electrocardiogram, and pulse oximetry.  MEDICATIONS amLODipine (NORVASC) 5 MG tablet furosemide (LASIX) 20 MG tablet losartan (COZAAR) 100 MG tablet Medications self-administered by patient taken the night of the study : N/A  SLEEP ARCHITECTURE The study was initiated at 9:35:32 PM and ended at 5:01:22 AM.  Sleep onset time was 11.3 minutes and the sleep efficiency was 67.0%%. The total sleep time was 298.5 minutes.  Stage REM latency was 213.0 minutes.  The patient spent 5.9%% of the night in stage N1 sleep, 84.9%% in stage N2 sleep, 1.7%% in stage N3 and 7.5% in REM.  Alpha intrusion was absent.  Supine sleep was 4.85%.  RESPIRATORY PARAMETERS The overall apnea/hypopnea index (AHI) was 1.2 per hour. The respiratory disturbance index (RDI) was 1.8/h. There were 0 total apneas, including 0 obstructive, 0 central and 0 mixed apneas. There were 6 hypopneas and 3 RERAs.  The AHI during Stage REM sleep was 0.0 per hour.  AHI while supine was 20.7 per hour.  The mean oxygen saturation was 93.8%. The minimum SpO2 during sleep was 88.0%.  Soft snoring  was noted during this study.  CARDIAC DATA The 2 lead EKG demonstrated sinus rhythm. The mean heart rate was 87.1 beats per minute. Other EKG findings include: None.  LEG MOVEMENT DATA The total PLMS were 0 with a resulting PLMS index of 0.0. Associated arousal with leg movement index was 0.0 .  IMPRESSIONS - No significant obstructive sleep apnea occurred overall during this study (AHI 1.2/h); however, moderate sleep apnea was present with supine sleep (AHI 20.7/h). - No significant central sleep apnea occurred during this study (CAI = 0.0/h). - The patient had minimal  oxygen desaturation during the study to a nadir of 88.0%. - Abnormal sleep architecture with reduction in slow wave and REM sleep and prolonged latency to REM sleep. - The patient snored with soft snoring volume. - No cardiac abnormalities were noted during this study. - Clinically significant periodic limb movements did not occur during sleep. No significant associated arousals.  DIAGNOSIS - Nocturnal Hypoxemia (327.26 [G47.36 ICD-10]) - Excessive daytime sleepiness  RECOMMENDATIONS - Effort should be made to optimize nasal and oropharyngeal patency. - The patient should be counseled to avoid supine sleep; consider positional therapy avoiding supine position during sleep. - If patient continues to have significant daytime sleepiness consider a multiple sleep latency test (MSLT) to assess for narcolepsy or idiopathic hypersomnia.  - Avoid alcohol, sedatives and other CNS depressants that may worsen sleep apnea and disrupt normal sleep architecture. - Sleep hygiene should be reviewed to assess factors that may improve sleep quality. - Weight management (BMI 32) and regular exercise should be initiated or continued if appropriate.  [Electronically signed] 03/03/2020 10:09 AM  Shelva Majestic MD, [facility],  ABSM Diplomate, American Board of Sleep Medicine   NPI: [id number] [place] PH: [phone]   FX: [phone] [place]

## 2020-03-19 ENCOUNTER — Telehealth: Payer: Self-pay | Admitting: *Deleted

## 2020-03-19 NOTE — Telephone Encounter (Signed)
Per Dr Claiborne Billings: DIAGNOSIS - Nocturnal Hypoxemia (327.26 [G47.36 ICD-10]) - Excessive daytime sleepiness  RECOMMENDATIONS - Effort should be made to optimize nasal and oropharyngeal patency. - The patient should be counseled to avoid supine sleep; consider positional therapy avoiding supine position during sleep. - If patient continues to have significant daytime sleepiness consider a multiple sleep latency test (MSLT) to assess for narcolepsy or idiopathic hypersomnia Pt is aware and agreeable to normal results.

## 2020-03-19 NOTE — Telephone Encounter (Signed)
-----   Message from Troy Sine, MD sent at 03/03/2020 10:17 AM EDT ----- Mariann Laster, please notify pt of results;  If persistent daytime sleepiness continue then consider MSLT evaluation.

## 2020-05-21 ENCOUNTER — Telehealth: Payer: Self-pay | Admitting: *Deleted

## 2020-05-21 NOTE — Telephone Encounter (Signed)
Patient stated she will call us back.

## 2020-07-31 ENCOUNTER — Other Ambulatory Visit: Payer: Self-pay

## 2020-07-31 MED ORDER — LOSARTAN POTASSIUM 100 MG PO TABS: 100.0000 mg | ORAL_TABLET | Freq: Every day | ORAL | 5 refills | Status: DC

## 2020-09-29 ENCOUNTER — Other Ambulatory Visit: Payer: Self-pay | Admitting: Cardiology

## 2020-12-18 ENCOUNTER — Other Ambulatory Visit: Payer: Self-pay

## 2020-12-18 ENCOUNTER — Encounter: Payer: Self-pay | Admitting: Internal Medicine

## 2020-12-18 ENCOUNTER — Ambulatory Visit: Payer: BC Managed Care – PPO | Admitting: Internal Medicine

## 2020-12-18 VITALS — BP 134/86 | HR 78 | Ht 65.0 in | Wt 188.6 lb

## 2020-12-18 DIAGNOSIS — Z136 Encounter for screening for cardiovascular disorders: Secondary | ICD-10-CM | POA: Diagnosis not present

## 2020-12-18 DIAGNOSIS — R0683 Snoring: Secondary | ICD-10-CM | POA: Diagnosis not present

## 2020-12-18 DIAGNOSIS — I10 Essential (primary) hypertension: Secondary | ICD-10-CM | POA: Diagnosis not present

## 2020-12-18 LAB — LIPID PANEL
Chol/HDL Ratio: 3.2 ratio (ref 0.0–4.4)
Cholesterol, Total: 171 mg/dL (ref 100–199)
HDL: 53 mg/dL (ref >39)
LDL Chol Calc (NIH): 97 mg/dL (ref 0–99)
Triglycerides: 116 mg/dL (ref 0–149)
VLDL Cholesterol Cal: 21 mg/dL (ref 5–40)

## 2020-12-18 LAB — BASIC METABOLIC PANEL
BUN/Creatinine Ratio: 17 (ref 9–23)
BUN: 9 mg/dL (ref 6–24)
CO2: 27 mmol/L (ref 20–29)
Calcium: 10.1 mg/dL (ref 8.7–10.2)
Chloride: 103 mmol/L (ref 96–106)
Creatinine, Ser: 0.52 mg/dL — ABNORMAL LOW (ref 0.57–1.00)
GFR calc Af Amer: 126 mL/min/{1.73_m2} (ref >59)
GFR calc non Af Amer: 109 mL/min/{1.73_m2} (ref >59)
Glucose: 93 mg/dL (ref 65–99)
Potassium: 4.7 mmol/L (ref 3.5–5.2)
Sodium: 142 mmol/L (ref 134–144)

## 2020-12-18 NOTE — Progress Notes (Signed)
Cardiology Office Note:    Date:  12/18/2020   ID:  Breanna Wang, DOB 1966-08-02, MRN [id number]  PCP:  Sharilyn Sites, MD  Cardiologist:  Elouise Munroe, MD  Electrophysiologist:  None   Referring MD: Sharilyn Sites, MD   Chief Complaint/Reason for Referral: HTN, CV risk  History of Present Illness:    Breanna Wang is a 55 y.o. female with a history of HTN, no significant OSA unless supine, and perimenopausal who presents for follow up.   She continues to have dizziness which was originally attributed to Covid infection.  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  She is still having some issues with dizziness but no treatment has otherwise been recommended.  Continues to have mildly elevated blood pressure.  Was previously recommended to start HCTZ but she was unaware of this recommendation.  She is keen to have her blood pressure optimized and it has been in the 130s over 90s-100 on checks at other physician's offices.  Currently taking amlodipine 5 mg daily and losartan 100 mg daily.  Amlodipine had to be lowered from 10 to 5 mg due to bothersome lower extremity swelling.  She had a friend in her 23s who had death as a result of myocardial infarction.  She felt this person was relatively healthy and was unaware that she had coronary artery disease.  She is concerned about screening for cardiovascular disease and we discussed options for testing in an asymptomatic patient today.  She denies chest pain, shortness of breath with activity, palpitations.  No significant swelling and when she does have swelling she takes as needed Lasix.  No recent syncope.  She has a smoking history, 15 years of smoking  up to 2 packs/day (30-pack-year smoking history).  She quit approximately 23 years ago.  Her ASCVD risk score is 2.1%, low risk.   Past Medical History:  Diagnosis Date  . Hypertension     Past Surgical History:  Procedure Laterality Date  . COLONOSCOPY N/A 05/09/2018   Procedure: COLONOSCOPY;  Surgeon: Rogene Houston, MD;  [facility];  Service: Endoscopy;  Laterality: N/A;  830  . DILATION AND CURETTAGE OF UTERUS    . POLYPECTOMY  05/09/2018   Procedure: POLYPECTOMY;  Surgeon: Rogene Houston, MD;  [facility];  Service: Endoscopy;;  colon  . TUBAL LIGATION    . uterine ablation      Current Medications: Current Meds  Medication Sig  . amLODipine (NORVASC) 5 MG tablet TAKE (1) TABLET BY MOUTH ONCE DAILY.  Marland Kitchen losartan (COZAAR) 100 MG tablet Take 1 tablet (100 mg total) by mouth daily.     Allergies:   Patient has no known allergies.   Social History   Tobacco Use  . Smoking status: Former Research scientist (life sciences)  . Smokeless tobacco: Never Used  . Tobacco comment: quit at age 69  Vaping Use  . Vaping Use: Never used  Substance Use Topics  . Alcohol use: Yes    Comment: occasionally  . Drug use: No     Family History: The patient's family history includes Cancer in her paternal grandmother; Diabetes in her father; High blood pressure in her brother, father, paternal grandfather, and paternal grandmother; Hypertension  in her mother; Lung cancer in her father; Thyroid disease (age of onset: 25) in her mother.  ROS:   Please see the history of present illness.    All other systems reviewed and are negative.  EKGs/Labs/Other Studies Reviewed:    The following studies were reviewed today:  EKG:  NSR, rate 78  Recent Labs: No results found for requested labs within last 8760 hours.  Recent Lipid Panel No results found for: CHOL, TRIG, HDL, CHOLHDL, VLDL, LDLCALC, LDLDIRECT  Physical Exam:    VS:  BP 134/86 (BP Location: Right Arm, Patient Position:  Sitting)   Pulse 78   Ht 5' 5"  (1.651 m)   Wt 188 lb 9.6 oz (85.5 kg)   SpO2 98%   BMI 31.38 kg/m     Wt Readings from Last 5 Encounters:  12/18/20 188 lb 9.6 oz (85.5 kg)  02/13/20 192 lb (87.1 kg)  12/20/19 192 lb 3.2 oz (87.2 kg)  01/10/19 182 lb (82.6 kg)  12/07/18 183 lb 6.4 oz (83.2 kg)    Constitutional: No acute distress Eyes: sclera non-icteric, normal conjunctiva and lids ENMT: normal dentition, moist mucous membranes Cardiovascular: regular rhythm, normal rate, no murmurs. S1 and S2 normal. Radial pulses normal bilaterally. No jugular venous distention.  Respiratory: clear to auscultation bilaterally GI : normal bowel sounds, soft and nontender. No distention.   MSK: extremities warm, well perfused. No edema.  NEURO: grossly nonfocal exam, moves all extremities. PSYCH: alert and oriented x 3, normal mood and affect.   ASSESSMENT:    1. Screening for cardiovascular condition   2. Essential hypertension   3. Snoring    PLAN:    Screening for cardiovascular condition - Plan: Lipid panel, CT CARDIAC SCORING (SELF PAY ONLY)  -She would like further risk ratification for cardiovascular risk.  We discussed obtaining a lipid panel and CT coronary calcium scoring given her smoking history, hypertension.  We reviewed that with a low ASCVD risk score of 2.1%, likely recommendations will be for diet lifestyle modification, but CT coronary calcium scoring may inform us of additional risk if significantly elevated.  We can determine any therapy needed after calcium scoring is complete we will also obtain a lipid panel.  Essential hypertension - Plan: EKG 27-POEU, Basic metabolic panel -Currently taking amlodipine 5 mg daily and losartan 100 mg daily.  Takes as needed Lasix.  We discussed adding HCTZ versus spironolactone.  I will obtain a be met, if favorable would recommend starting spironolactone 25 mg daily.  She has evidence of elevated LVEDP on her echocardiogram from 2019,  and may have diastolic dysfunction.  She may benefit from spironolactone in this regard.  Snoring -No significant OSA unless supine, she does not sleep on her back.  Continue to observe.  Total time of encounter: 45 minutes total time of encounter, including 30 minutes spent in face-to-face patient care on the date of this encounter. This time includes coordination of care and counseling regarding above mentioned problem list. Remainder of non-face-to-face time involved reviewing chart documents/testing relevant to the patient encounter and documentation in the medical record. I have independently reviewed documentation from referring provider.   Cherlynn Kaiser, MD [place]  [facility]    Medication Adjustments/Labs and Tests Ordered: Current medicines are reviewed at length with the patient today.  Concerns regarding medicines are outlined above.   Orders Placed This Encounter  Procedures  . CT CARDIAC SCORING (SELF PAY ONLY)  . Basic metabolic panel  . Lipid panel  .  EKG 12-Lead    No orders of the defined types were placed in this encounter.   Patient Instructions  Medication Instructions:  No Changes In Medications at this time.  *If you need a refill on your cardiac medications before your next appointment, please call your pharmacy*  Lab Work: BMET/LIPID- TODAY  If you have labs (blood work) drawn today and your tests are completely normal, you will receive your results only by: Marland Kitchen MyChart Message (if you have MyChart) OR . A paper copy in the mail If you have any lab test that is abnormal or we need to change your treatment, we will call you to review the results.  Testing/Procedures: Dr. Margaretann Loveless  has ordered a CT coronary calcium score. This test is done at [address]. This is $99 out of pocket.  Coronary CalciumScan A coronary calcium scan is an imaging test used to look for deposits of calcium and other fatty materials (plaques) in the  inner lining of the blood vessels of the heart (coronary arteries). These deposits of calcium and plaques can partly clog and narrow the coronary arteries without producing any symptoms or warning signs. This puts a person at risk for a heart attack. This test can detect these deposits before symptoms develop. Tell a health care provider about:  Any allergies you have.  All medicines you are taking, including vitamins, herbs, eye drops, creams, and over-the-counter medicines.  Any problems you or family members have had with anesthetic medicines.  Any blood disorders you have.  Any surgeries you have had.  Any medical conditions you have.  Whether you are pregnant or may be pregnant. What are the risks? Generally, this is a safe procedure. However, problems may occur, including:  Harm to a pregnant woman and her unborn baby. This test involves the use of radiation. Radiation exposure can be dangerous to a pregnant woman and her unborn baby. If you are pregnant, you generally should not have this procedure done.  Slight increase in the risk of cancer. This is because of the radiation involved in the test. What happens before the procedure? No preparation is needed for this procedure. What happens during the procedure?  You will undress and remove any jewelry around your neck or chest.  You will put on a hospital gown.  Sticky electrodes will be placed on your chest. The electrodes will be connected to an electrocardiogram (ECG) machine to record a tracing of the electrical activity of your heart.  A CT scanner will take pictures of your heart. During this time, you will be asked to lie still and hold your breath for 2-3 seconds while a picture of your heart is being taken. The procedure may vary among health care providers and hospitals. What happens after the procedure?  You can get dressed.  You can return to your normal activities.  It is up to you to get the results of your  test. Ask your health care provider, or the department that is doing the test, when your results will be ready. Summary  A coronary calcium scan is an imaging test used to look for deposits of calcium and other fatty materials (plaques) in the inner lining of the blood vessels of the heart (coronary arteries).  Generally, this is a safe procedure. Tell your health care provider if you are pregnant or may be pregnant.  No preparation is needed for this procedure.  A CT scanner will take pictures of your heart.  You can return to your normal activities after the scan is done. This information is not intended to replace advice given to you by your health care provider. Make sure you discuss any questions you have with your health care provider. Document Released: 05/05/2008 Document Revised: 09/26/2016 Document Reviewed: 09/26/2016 Elsevier Interactive Patient Education  2017 [place]: At [facility], you and your health needs are our priority.  As part of our continuing mission to provide you with exceptional heart care, we have created designated Provider Care Teams.  These Care Teams include your primary Cardiologist (physician) and Advanced Practice Providers (APPs -  Physician Assistants and Nurse Practitioners) who all work together to provide you with the care you need, when you need it.  Your next appointment:   FOLLOW UP AFTER CALCIUM SCORE   The format for your next appointment:   In Person  Provider:   Dr. Margaretann Loveless

## 2020-12-18 NOTE — Patient Instructions (Signed)
Medication Instructions:  No Changes In Medications at this time.  *If you need a refill on your cardiac medications before your next appointment, please call your pharmacy*  Lab Work: BMET/LIPID- TODAY  If you have labs (blood work) drawn today and your tests are completely normal, you will receive your results only by:  [place] (if you have MyChart) OR  A paper copy in the mail If you have any lab test that is abnormal or we need to change your treatment, we will call you to review the results.  Testing/Procedures: Dr. Margaretann Loveless  has ordered a CT coronary calcium score. This test is done at [address]. This is $99 out of pocket.  Coronary CalciumScan A coronary calcium scan is an imaging test used to look for deposits of calcium and other fatty materials (plaques) in the inner lining of the blood vessels of the heart (coronary arteries). These deposits of calcium and plaques can partly clog and narrow the coronary arteries without producing any symptoms or warning signs. This puts a person at risk for a heart attack. This test can detect these deposits before symptoms develop. Tell a health care provider about:  Any allergies you have.  All medicines you are taking, including vitamins, herbs, eye drops, creams, and over-the-counter medicines.  Any problems you or family members have had with anesthetic medicines.  Any blood disorders you have.  Any surgeries you have had.  Any medical conditions you have.  Whether you are pregnant or may be pregnant. What are the risks? Generally, this is a safe procedure. However, problems may occur, including:  Harm to a pregnant woman and her unborn baby. This test involves the use of radiation. Radiation exposure can be dangerous to a pregnant woman and her unborn baby. If you are pregnant, you generally should not have this procedure done.  Slight increase in the risk of cancer. This is because of the radiation  involved in the test. What happens before the procedure? No preparation is needed for this procedure. What happens during the procedure?  You will undress and remove any jewelry around your neck or chest.  You will put on a hospital gown.  Sticky electrodes will be placed on your chest. The electrodes will be connected to an electrocardiogram (ECG) machine to record a tracing of the electrical activity of your heart.  A CT scanner will take pictures of your heart. During this time, you will be asked to lie still and hold your breath for 2-3 seconds while a picture of your heart is being taken. The procedure may vary among health care providers and hospitals. What happens after the procedure?  You can get dressed.  You can return to your normal activities.  It is up to you to get the results of your test. Ask your health care provider, or the department that is doing the test, when your results will be ready. Summary  A coronary calcium scan is an imaging test used to look for deposits of calcium and other fatty materials (plaques) in the inner lining of the blood vessels of the heart (coronary arteries).  Generally, this is a safe procedure. Tell your health care provider if you are pregnant or may be pregnant.  No preparation is needed for this procedure.  A CT scanner will take pictures of your heart.  You can return to your normal activities after the scan is done. This information is not intended to replace advice given to  you by your health care provider. Make sure you discuss any questions you have with your health care provider. Document Released: 05/05/2008 Document Revised: 09/26/2016 Document Reviewed: 09/26/2016 Elsevier Interactive Patient Education  2017 [place]: At [facility], you and your health needs are our priority.  As part of our continuing mission to provide you with exceptional heart care, we have created designated Provider Care Teams.   These Care Teams include your primary Cardiologist (physician) and Advanced Practice Providers (APPs -  Physician Assistants and Nurse Practitioners) who all work together to provide you with the care you need, when you need it.  Your next appointment:   FOLLOW UP AFTER CALCIUM SCORE   The format for your next appointment:   In Person  Provider:   Dr. Margaretann Loveless

## 2021-01-04 MED ORDER — HYDROCHLOROTHIAZIDE 12.5 MG PO CAPS: 12.5000 mg | ORAL_CAPSULE | Freq: Every day | ORAL | 3 refills | Status: DC

## 2021-01-08 ENCOUNTER — Other Ambulatory Visit: Payer: Self-pay

## 2021-01-08 ENCOUNTER — Ambulatory Visit (INDEPENDENT_AMBULATORY_CARE_PROVIDER_SITE_OTHER)
Admission: RE | Admit: 2021-01-08 | Discharge: 2021-01-08 | Disposition: A | Discharge status: Home / Self Care | Payer: Self-pay | Source: Ambulatory Visit | Attending: Internal Medicine | Admitting: Internal Medicine

## 2021-01-08 DIAGNOSIS — Z136 Encounter for screening for cardiovascular disorders: Secondary | ICD-10-CM

## 2021-01-08 MED ORDER — AMLODIPINE BESYLATE 5 MG PO TABS: ORAL_TABLET | ORAL | 3 refills | Status: DC

## 2021-01-08 NOTE — Addendum Note (Signed)
Addended by: Rexanne Mano B on: 01/08/2021 08:15 AM   Modules accepted: Orders

## 2021-03-05 ENCOUNTER — Telehealth: Payer: Self-pay | Admitting: Internal Medicine

## 2021-03-17 ENCOUNTER — Ambulatory Visit: Payer: BC Managed Care – PPO | Admitting: Internal Medicine

## 2021-03-17 ENCOUNTER — Encounter: Payer: Self-pay | Admitting: Internal Medicine

## 2021-03-17 ENCOUNTER — Other Ambulatory Visit: Payer: Self-pay

## 2021-03-17 VITALS — BP 140/100 | HR 86 | Ht 65.0 in | Wt 187.6 lb

## 2021-03-17 DIAGNOSIS — R42 Dizziness and giddiness: Secondary | ICD-10-CM

## 2021-03-17 DIAGNOSIS — Z79899 Other long term (current) drug therapy: Secondary | ICD-10-CM

## 2021-03-17 DIAGNOSIS — I10 Essential (primary) hypertension: Secondary | ICD-10-CM | POA: Diagnosis not present

## 2021-03-17 DIAGNOSIS — R002 Palpitations: Secondary | ICD-10-CM | POA: Diagnosis not present

## 2021-03-17 MED ORDER — METOPROLOL TARTRATE 25 MG PO TABS
25.0000 mg | ORAL_TABLET | Freq: Two times a day (BID) | ORAL | 3 refills | Status: DC
Start: 2021-03-17 — End: 2021-08-13

## 2021-03-17 NOTE — Patient Instructions (Addendum)
Medication Instructions:  START: METOPROLOL TARTRATE 25mg  TWICE DAILY  STOP: AMLODIPINE STOP: HCTZ STOP: LOSARTAN  *If you need a refill on your cardiac medications before your next appointment, please call your pharmacy*  Follow-Up: At [facility], you and your health needs are our priority.  As part of our continuing mission to provide you with exceptional heart care, we have created designated Provider Care Teams.  These Care Teams include your primary Cardiologist (physician) and Advanced Practice Providers (APPs -  Physician Assistants and Nurse Practitioners) who all work together to provide you with the care you need, when you need it.  Your next appointment:   3-4 MONTHS- NEXT AVAILABLE  The format for your next appointment:   In Person  Provider:   Cherlynn Kaiser, MD  Other Instructions PLEASE [place] PCP REGARDING NEUROLOGY REFERRAL

## 2021-03-17 NOTE — Progress Notes (Signed)
Cardiology Office Note:    Date:  03/17/2021   ID:  [name], DOB Oct 30, 1966, MRN [id number]  PCP:  Sharilyn Sites, MD  Cardiologist:  Elouise Munroe, MD  Electrophysiologist:  None   Referring MD: Sharilyn Sites, MD   Chief Complaint/Reason for Referral: Dizziness on antihypertensive therapy.  History of Present Illness:    Breanna Wang is a 55 y.o. female with a history of history of HTN, no significant OSA unless supine, and perimenopausal who presents for follow up.    Lost 4lbs in 8 days.  Attempting to lose weight with a vegetarian diet.  She felt very unwell on her antihypertensive therapy and had significant dizziness.  The dizziness is waxing and waning and somewhat chronic though.  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  This is from a brain MRI from March 2021.  I have asked her to follow-up with her primary care doctor and neurology to assess this further since this is outside of my area of expertise.  However, due to worsening symptoms of dizziness she has independently stopped amlodipine, hydrochlorothiazide, and losartan.  Denies chest pain, shortness of breath.  Does occasionally have palpitations.  No PND, orthopnea, leg swelling.   Past Medical History:  Diagnosis Date  . Hypertension     Past Surgical History:  Procedure Laterality Date  . COLONOSCOPY N/A 05/09/2018   Procedure: COLONOSCOPY;  Surgeon: Rogene Houston, MD;  [facility];  Service: Endoscopy;  Laterality: N/A;  830  . DILATION AND CURETTAGE OF UTERUS    . POLYPECTOMY  05/09/2018   Procedure: POLYPECTOMY;  Surgeon: Rogene Houston, MD;  Location: [facility];  Service: Endoscopy;;  colon  . TUBAL LIGATION    . uterine ablation      Current Medications: Current Meds  Medication Sig  . furosemide (LASIX) 20 MG  tablet Take 1 tablet (20 mg total) by mouth daily.  . metoprolol tartrate (LOPRESSOR) 25 MG tablet Take 1 tablet (25 mg total) by mouth 2 (two) times daily.     Allergies:   Patient has no known allergies.   Social History   Tobacco Use  . Smoking status: Former Research scientist (life sciences)  . Smokeless tobacco: Never Used  . Tobacco comment: quit at age 28  Vaping Use  . Vaping Use: Never used  Substance Use Topics  . Alcohol use: Yes    Comment: occasionally  . Drug use: No     Family History: The patient's family history includes Cancer in her paternal grandmother; Diabetes in her father; High blood pressure in her brother, father, paternal grandfather, and paternal grandmother; Hypertension in her mother; Lung cancer in her father; Thyroid disease (age of onset: 76) in her mother.  ROS:   Please see the history of present illness.    All other systems reviewed and are negative.  EKGs/Labs/Other Studies Reviewed:    The following studies were reviewed today:  EKG:  NSR    Recent Labs: 12/18/2020: BUN 9; Creatinine, Ser 0.52; Potassium 4.7; Sodium 142  Recent Lipid Panel    Component Value Date/Time   CHOL 171 12/18/2020 0858   TRIG 116 12/18/2020 0858   HDL 53 12/18/2020 0858   CHOLHDL 3.2 12/18/2020 0858   LDLCALC 97 12/18/2020 0858    Physical Exam:    VS:  BP (!) 140/100 (BP Location: Left  Arm, Patient Position: Sitting, Cuff Size: Normal)   Pulse 86   Ht 5\' 5"  (1.651 m)   Wt 187 lb 9.6 oz (85.1 kg)   BMI 31.22 kg/m     Wt Readings from Last 5 Encounters:  03/17/21 187 lb 9.6 oz (85.1 kg)  12/18/20 188 lb 9.6 oz (85.5 kg)  02/13/20 192 lb (87.1 kg)  12/20/19 192 lb 3.2 oz (87.2 kg)  01/10/19 182 lb (82.6 kg)    Constitutional: No acute distress Eyes: sclera non-icteric, normal conjunctiva and lids ENMT: normal dentition, moist mucous membranes Cardiovascular: regular rhythm, normal rate, no murmurs. S1 and S2 normal. Radial pulses normal bilaterally. No jugular venous  distention.  Respiratory: clear to auscultation bilaterally GI : normal bowel sounds, soft and nontender. No distention.   MSK: extremities warm, well perfused. No edema.  NEURO: grossly nonfocal exam, moves all extremities. PSYCH: alert and oriented x 3, normal mood and affect.   ASSESSMENT:    1. Dizziness   2. Essential hypertension   3. Palpitations   4. Medication management    PLAN:    Dizziness Essential hypertension - Plan: EKG 12-Lead Palpitations Medication management  She does feel better on no antihypertensive therapy.  Her symptoms are not entirely resolved and started prior to initiation of all antihypertensive agents.  Given mild hypertension, it is reasonable for her to stay off of these medications and observe her symptoms.  I would like for her to be seen by neurology to better understand what is the management of the vascular tortuosity causing mass-effect to see if this may be part of her symptomatology.  Previously discussed the use of metoprolol for palpitations which continue at this time.  Given that she will be off of all antihypertensive therapy, she may benefit from some beta-blockade for symptoms and blood pressure control.  I suspect blood pressure control will be modest.  I would like to see her back in close follow-up, but anticipate further work-up of noncardiac causes of dizziness in the interim.  She will call me with any concerns and should document her blood pressure daily.  Total time of encounter: 30 minutes total time of encounter, including 20 minutes spent in face-to-face patient care on the date of this encounter. This time includes coordination of care and counseling regarding above mentioned problem list. Remainder of non-face-to-face time involved reviewing chart documents/testing relevant to the patient encounter and documentation in the medical record. I have independently reviewed documentation from referring provider.   Cherlynn Kaiser, MD,  [facility]    Medication Adjustments/Labs and Tests Ordered: Current medicines are reviewed at length with the patient today.  Concerns regarding medicines are outlined above.   Orders Placed This Encounter  Procedures  . EKG 12-Lead    Meds ordered this encounter  Medications  . metoprolol tartrate (LOPRESSOR) 25 MG tablet    Sig: Take 1 tablet (25 mg total) by mouth 2 (two) times daily.    Dispense:  60 tablet    Refill:  3    Patient Instructions  Medication Instructions:  START: METOPROLOL TARTRATE 25mg  TWICE DAILY  STOP: AMLODIPINE STOP: HCTZ STOP: LOSARTAN  *If you need a refill on your cardiac medications before your next appointment, please call your pharmacy*  Follow-Up: At [facility], you and your health needs are our priority.  As part of our continuing mission to provide you with exceptional heart care, we have created designated Provider Care Teams.  These Care Teams  include your primary Cardiologist (physician) and Advanced Practice Providers (APPs -  Physician Assistants and Nurse Practitioners) who all work together to provide you with the care you need, when you need it.  Your next appointment:   3-4 MONTHS- NEXT AVAILABLE  The format for your next appointment:   In Person  Provider:   Cherlynn Kaiser, MD  Other Instructions PLEASE [place] PCP REGARDING NEUROLOGY REFERRAL

## 2021-07-12 ENCOUNTER — Ambulatory Visit: Payer: BC Managed Care – PPO | Admitting: Internal Medicine

## 2021-07-26 NOTE — Progress Notes (Signed)
Cardiology Office Note:    Date:  03/17/2021   ID:  Breanna Wang, DOB [birth year], MRN [id number]  PCP:  Sharilyn Sites, MD  Cardiologist:  Elouise Munroe, MD  Electrophysiologist:  None   Referring MD: Sharilyn Sites, MD   Chief Complaint/Reason for Referral: Dizziness on antihypertensive therapy.  History of Present Illness:    Breanna Wang is a 55 y.o. female with a history of history of HTN, no significant OSA unless supine, and perimenopausal who presents for follow up.    Symptoms have improved today. Received steroids for herniated disk and dizziness resolved.  Stlil taking metoprolol, rare use of lasix when needed. BP elevated most days, feels she needs further BP control.   The patient denies chest pain, chest pressure, dyspnea at rest or with exertion, palpitations, PND, orthopnea, or leg swelling. Denies cough, fever, chills. Denies nausea, vomiting. Denies syncope or presyncope. Denies dizziness or lightheadedness.   Past Medical History:  Diagnosis Date   Hypertension     Past Surgical History:  Procedure Laterality Date   COLONOSCOPY N/A 05/09/2018   Procedure: COLONOSCOPY;  Surgeon: Rogene Houston, MD;  [facility];  Service: Endoscopy;  Laterality: N/A;  [place]     POLYPECTOMY  05/09/2018   Procedure: POLYPECTOMY;  Surgeon: Rogene Houston, MD;  [facility];  Service: Endoscopy;;  colon   TUBAL LIGATION     uterine ablation      Current Medications: Current Meds  Medication Sig   amLODipine (NORVASC) 5 MG tablet Take 1 tablet (5 mg total) by mouth daily.   metoprolol tartrate (LOPRESSOR) 25 MG tablet Take 1 tablet (25 mg total) by mouth 2 (two) times daily.     Allergies:   Patient has no known allergies.   Social History   Tobacco Use   Smoking status: Former   Smokeless tobacco: Never   Tobacco comments:    quit at age 34  Vaping Use   Vaping Use: Never used  Substance  Use Topics   Alcohol use: Yes    Comment: occasionally   Drug use: No     Family History: The patient's family history includes Cancer in her paternal grandmother; Diabetes in her father; High blood pressure in her brother, father, paternal grandfather, and paternal grandmother; Hypertension in her mother; Lung cancer in her father; Thyroid disease (age of onset: 23) in her mother.  ROS:   Please see the history of present illness.    All other systems reviewed and are negative.  EKGs/Labs/Other Studies Reviewed:    The following studies were reviewed today:  EKG:  not obtained today. 03/17/21: NSR  Recent Labs: 12/18/2020: BUN 9; Creatinine, Ser 0.52; Potassium 4.7; Sodium 142  Recent Lipid Panel    Component Value Date/Time   CHOL 171 12/18/2020 0858   TRIG 116 12/18/2020 0858   HDL 53 12/18/2020 0858   CHOLHDL 3.2 12/18/2020 0858   LDLCALC 97 12/18/2020 0858    Physical Exam:    VS:  BP (!) 140/92 (BP Location: Left Arm)   Pulse 65   Ht '5\' 5"'$  (1.651 m)   Wt 193 lb 3.2 oz (87.6 kg)   SpO2 97%   BMI 32.15 kg/m     Wt Readings from Last 5 Encounters:  07/27/21 193 lb 3.2 oz (87.6 kg)  03/17/21 187 lb 9.6 oz (85.1 kg)  12/18/20 188 lb 9.6 oz (85.5 kg)  02/13/20 192  lb (87.1 kg)  12/20/19 192 lb 3.2 oz (87.2 kg)    Constitutional: No acute distress Eyes: sclera non-icteric, normal conjunctiva and lids ENMT: normal dentition, moist mucous membranes Cardiovascular: regular rhythm, normal rate, no murmurs. S1 and S2 normal. Radial pulses normal bilaterally. No jugular venous distention.  Respiratory: clear to auscultation bilaterally GI : normal bowel sounds, soft and nontender. No distention.   MSK: extremities warm, well perfused. No edema.  NEURO: grossly nonfocal exam, moves all extremities. PSYCH: alert and oriented x 3, normal mood and affect.    ASSESSMENT:    1. Dizziness   2. Essential hypertension   3. Palpitations   4. Medication management   5.  Snoring   6. Excessive daytime sleepiness     PLAN:    Dizziness Essential hypertension  Palpitations Medication management - continues on metoprolol tartrate 25 mg BID. Palpitations are stable. Dizziness resolved.  - she is taking lasix 20 mg rarely prn as well.  - will resume amlodipine 5 mg daily. If LE swelling, will transition to losartan.  Snoring Excessive daytime sleepiness - no significant OSA but did have positional, supine dependent moderate OSA. Recommended not to sleep on back. May need MLST if daytime sleepiness not improved.  HLD -  LDL 97, cor cal score of 19, will recheck lipids and if above goal talk about statin vs continued diet management.  Total time of encounter: 30 minutes total time of encounter, including 20 minutes spent in face-to-face patient care on the date of this encounter. This time includes coordination of care and counseling regarding above mentioned problem list. Remainder of non-face-to-face time involved reviewing chart documents/testing relevant to the patient encounter and documentation in the medical record. I have independently reviewed documentation from referring provider.   Cherlynn Kaiser, MD, [facility]     Medication Adjustments/Labs and Tests Ordered: Current medicines are reviewed at length with the patient today.  Concerns regarding medicines are outlined above.   Orders Placed This Encounter  Procedures   Lipid panel     Meds ordered this encounter  Medications   amLODipine (NORVASC) 5 MG tablet    Sig: Take 1 tablet (5 mg total) by mouth daily.    Dispense:  90 tablet    Refill:  3     Patient Instructions  Medication Instructions:  START: AMLODIPINE '5mg'$  DAILY  *If you need a refill on your cardiac medications before your next appointment, please call your pharmacy*  Lab Work: LIPID PANEL TODAY  If you have labs (blood work) drawn today and your tests are completely normal, you will  receive your results only by: [place] (if you have MyChart) OR A paper copy in the mail If you have any lab test that is abnormal or we need to change your treatment, we will call you to review the results.  Follow-Up: At [facility], you and your health needs are our priority.  As part of our continuing mission to provide you with exceptional heart care, we have created designated Provider Care Teams.  These Care Teams include your primary Cardiologist (physician) and Advanced Practice Providers (APPs -  Physician Assistants and Nurse Practitioners) who all work together to provide you with the care you need, when you need it.   Your next appointment:    MARCH 29th at 8:20AM  The format for your next appointment:   In Person  Provider:   Cherlynn Kaiser, MD

## 2021-07-27 ENCOUNTER — Ambulatory Visit: Payer: BC Managed Care – PPO | Admitting: Internal Medicine

## 2021-07-27 ENCOUNTER — Encounter: Payer: Self-pay | Admitting: Internal Medicine

## 2021-07-27 ENCOUNTER — Other Ambulatory Visit: Payer: Self-pay

## 2021-07-27 VITALS — BP 140/92 | HR 65 | Ht 65.0 in | Wt 193.2 lb

## 2021-07-27 DIAGNOSIS — Z79899 Other long term (current) drug therapy: Secondary | ICD-10-CM

## 2021-07-27 DIAGNOSIS — R42 Dizziness and giddiness: Secondary | ICD-10-CM | POA: Diagnosis not present

## 2021-07-27 DIAGNOSIS — I10 Essential (primary) hypertension: Secondary | ICD-10-CM | POA: Diagnosis not present

## 2021-07-27 DIAGNOSIS — G4719 Other hypersomnia: Secondary | ICD-10-CM

## 2021-07-27 DIAGNOSIS — R002 Palpitations: Secondary | ICD-10-CM | POA: Diagnosis not present

## 2021-07-27 DIAGNOSIS — R0683 Snoring: Secondary | ICD-10-CM

## 2021-07-27 MED ORDER — AMLODIPINE BESYLATE 5 MG PO TABS: 5.0000 mg | ORAL_TABLET | Freq: Every day | ORAL | 3 refills | Status: DC

## 2021-07-27 NOTE — Patient Instructions (Addendum)
Medication Instructions:  START: AMLODIPINE '5mg'$  DAILY  *If you need a refill on your cardiac medications before your next appointment, please call your pharmacy*  Lab Work: LIPID PANEL TODAY  If you have labs (blood work) drawn today and your tests are completely normal, you will receive your results only by: [place] (if you have MyChart) OR A paper copy in the mail If you have any lab test that is abnormal or we need to change your treatment, we will call you to review the results.  Follow-Up: At [facility], you and your health needs are our priority.  As part of our continuing mission to provide you with exceptional heart care, we have created designated Provider Care Teams.  These Care Teams include your primary Cardiologist (physician) and Advanced Practice Providers (APPs -  Physician Assistants and Nurse Practitioners) who all work together to provide you with the care you need, when you need it.   Your next appointment:    MARCH 29th at 8:20AM  The format for your next appointment:   In Person  Provider:   Cherlynn Kaiser, MD  OTHER:  Mediterranean Diet A Mediterranean diet refers to food and lifestyle choices that are based on the traditions of countries located on the [place]. This way of eating has been shown to help prevent certain conditions and improve outcomes for people who have chronic diseases, like kidney disease and heart disease. What are tips for following this plan? Lifestyle Cook and eat meals together with your family, when possible. Drink enough fluid to keep your urine clear or pale yellow. Be physically active every day. This includes: Aerobic exercise like running or swimming. Leisure activities like gardening, walking, or housework. Get 7-8 hours of sleep each night. If recommended by your health care provider, drink red wine in moderation. This means 1 glass a day for nonpregnant women and 2 glasses a day for men. A glass of wine  equals 5 oz (150 mL). Reading food labels  Check the serving size of packaged foods. For foods such as rice and pasta, the serving size refers to the amount of cooked product, not dry. Check the total fat in packaged foods. Avoid foods that have saturated fat or trans fats. Check the ingredients list for added sugars, such as corn syrup. Shopping At the grocery store, buy most of your food from the areas near the walls of the store. This includes: Fresh fruits and vegetables (produce). Grains, beans, nuts, and seeds. Some of these may be available in unpackaged forms or large amounts (in bulk). Fresh seafood. Poultry and eggs. Low-fat dairy products. Buy whole ingredients instead of prepackaged foods. Buy fresh fruits and vegetables in-season from local farmers markets. Buy frozen fruits and vegetables in resealable bags. If you do not have access to quality fresh seafood, buy precooked frozen shrimp or canned fish, such as tuna, salmon, or sardines. Buy small amounts of raw or cooked vegetables, salads, or olives from the deli or salad bar at your store. Stock your pantry so you always have certain foods on hand, such as olive oil, canned tuna, canned tomatoes, rice, pasta, and beans. Cooking Cook foods with extra-virgin olive oil instead of using butter or other vegetable oils. Have meat as a side dish, and have vegetables or grains as your main dish. This means having meat in small portions or adding small amounts of meat to foods like pasta or stew. Use beans or vegetables instead of meat in common dishes like  chili or lasagna. Experiment with different cooking methods. Try roasting or broiling vegetables instead of steaming or sauteing them. Add frozen vegetables to soups, stews, pasta, or rice. Add nuts or seeds for added healthy fat at each meal. You can add these to yogurt, salads, or vegetable dishes. Marinate fish or vegetables using olive oil, lemon juice, garlic, and fresh  herbs. Meal planning  Plan to eat 1 vegetarian meal one day each week. Try to work up to 2 vegetarian meals, if possible. Eat seafood 2 or more times a week. Have healthy snacks readily available, such as: Vegetable sticks with hummus. Greek yogurt. Fruit and nut trail mix. Eat balanced meals throughout the week. This includes: Fruit: 2-3 servings a day Vegetables: 4-5 servings a day Low-fat dairy: 2 servings a day Fish, poultry, or lean meat: 1 serving a day Beans and legumes: 2 or more servings a week Nuts and seeds: 1-2 servings a day Whole grains: 6-8 servings a day Extra-virgin olive oil: 3-4 servings a day Limit red meat and sweets to only a few servings a month What are my food choices? Mediterranean diet Recommended Grains: Whole-grain pasta. Brown rice. Bulgar wheat. Polenta. Couscous. Whole-wheat bread. [place]. Vegetables: Artichokes. Beets. Broccoli. Cabbage. Carrots. Eggplant. Green beans. Chard. Kale. Spinach. Onions. Leeks. Peas. Squash. Tomatoes. Peppers. Radishes. Fruits: Apples. Apricots. Avocado. Berries. Bananas. Cherries. Dates. Figs. Grapes. Lemons. Melon. Oranges. Peaches. Plums. Pomegranate. Meats and other protein foods: Beans. Almonds. Sunflower seeds. Pine nuts. Peanuts. [place]. Salmon. Scallops. Shrimp. [place]. Tilapia. Clams. Oysters. Eggs. Dairy: Low-fat milk. Cheese. Greek yogurt. Beverages: Water. Red wine. Herbal tea. Fats and oils: Extra virgin olive oil. Avocado oil. Grape seed oil. Sweets and desserts: [place] yogurt with honey. Baked apples. Poached pears. Trail mix. Seasoning and other foods: Basil. Cilantro. Coriander. Cumin. Mint. Parsley. Sage. Rosemary. Tarragon. Garlic. Oregano. Thyme. Pepper. Balsalmic vinegar. Tahini. Hummus. Tomato sauce. Olives. Mushrooms. Limit these Grains: Prepackaged pasta or rice dishes. Prepackaged cereal with added sugar. Vegetables: Deep fried potatoes (french fries). Fruits: Fruit canned in syrup. Meats and  other protein foods: Beef. Pork. Lamb. Poultry with skin. Hot dogs. [place] Salines. Dairy: Ice cream. Sour cream. Whole milk. Beverages: Juice. Sugar-sweetened soft drinks. Beer. Liquor and spirits. Fats and oils: Butter. Canola oil. Vegetable oil. Beef fat (tallow). Lard. Sweets and desserts: Cookies. Cakes. Pies. Candy. Seasoning and other foods: Mayonnaise. Premade sauces and marinades. The items listed may not be a complete list. Talk with your dietitian about what dietary choices are right for you. Summary The Mediterranean diet includes both food and lifestyle choices. Eat a variety of fresh fruits and vegetables, beans, nuts, seeds, and whole grains. Limit the amount of red meat and sweets that you eat. Talk with your health care provider about whether it is safe for you to drink red wine in moderation. This means 1 glass a day for nonpregnant women and 2 glasses a day for men. A glass of wine equals 5 oz (150 mL). This information is not intended to replace advice given to you by your health care provider. Make sure you discuss any questions you have with your health care provider. Document Revised: 07/07/2016 Document Reviewed: 06/30/2016 Elsevier Patient Education  [place].

## 2021-08-04 LAB — LIPID PANEL
Chol/HDL Ratio: 3.3 ratio (ref 0.0–4.4)
Cholesterol, Total: 161 mg/dL (ref 100–199)
HDL: 49 mg/dL (ref >39)
LDL Chol Calc (NIH): 93 mg/dL (ref 0–99)
Triglycerides: 102 mg/dL (ref 0–149)
VLDL Cholesterol Cal: 19 mg/dL (ref 5–40)

## 2021-08-10 ENCOUNTER — Other Ambulatory Visit: Payer: Self-pay | Admitting: Internal Medicine

## 2021-08-13 MED ORDER — METOPROLOL TARTRATE 25 MG PO TABS: 25.0000 mg | ORAL_TABLET | Freq: Two times a day (BID) | ORAL | 3 refills | Status: DC

## 2021-08-16 IMAGING — MR MR HEAD WO/W CM
12 series · 48 of 48 positions shown · IV contrast (17ml Multihance)
Comparison: None.

CLINICAL DATA: 53-year-old female with severe dizziness since
[REDACTED]. Bilateral arm numbness and difficulty walking.

Creatinine was obtained on site at [HOSPITAL] at [HOSPITAL].
Results: Creatinine 0.5 mg/dL.
EXAM:
MRI HEAD WITHOUT AND WITH CONTRAST
TECHNIQUE: Multiplanar, multiecho pulse sequences of the brain and surrounding
structures were obtained without and with intravenous contrast.
CONTRAST:  17mL MULTIHANCE GADOBENATE DIMEGLUMINE 529 MG/ML IV SOLN

[Series 2: T1 · sagittal · 5.0mm · 0.45mm/px · 1 of 21 slices shown]
[im 1/21]
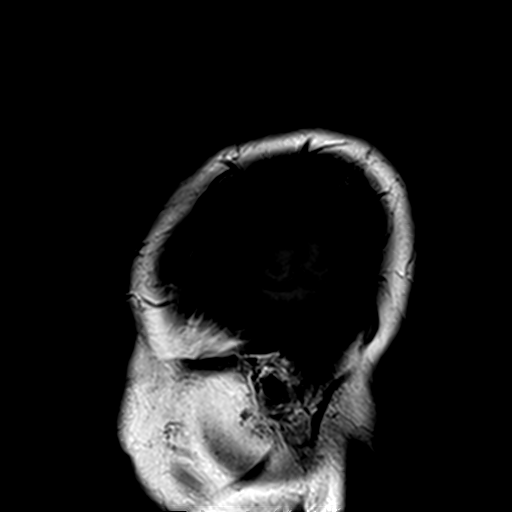

[Series 3: DWI · axial · 3.0mm · 1.80mm/px · z∈[-63,+84]mm · 6 of 100 slices shown (1 of 4)]
[im 1/100]
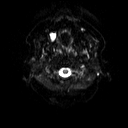
[im 20/100]
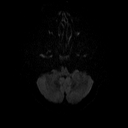
[im 40/100]
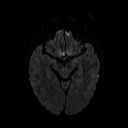
[im 60/100]
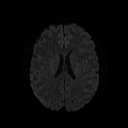
[im 80/100]
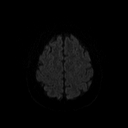
[im 100/100]
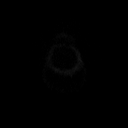

[Series 4: DWI · axial · 3.0mm · 1.80mm/px · z∈[-63,+84]mm · 3 of 50 slices shown (2 of 4)]
[im 1/50]
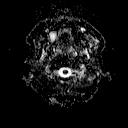
[im 25/50]
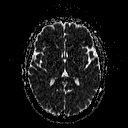
[im 50/50]
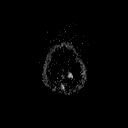

[Series 5: DWI · coronal · 5.0mm · 1.80mm/px · 5 of 68 slices shown (3 of 4)]
[im 1/68]
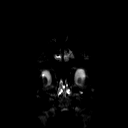
[im 17/68]
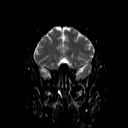
[im 34/68]
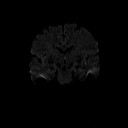
[im 51/68]
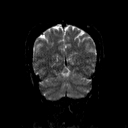
[im 68/68]
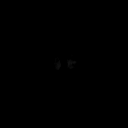

[Series 6: DWI · coronal · 5.0mm · 1.80mm/px · 2 of 34 slices shown (4 of 4)]
[im 1/34]
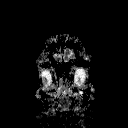
[im 34/34]
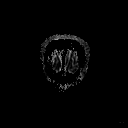

[Series 7: T2 · axial · 5.0mm · 0.51mm/px · z∈[-67,+88]mm · 2 of 24 slices shown (1 of 2)]
[im 1/24]
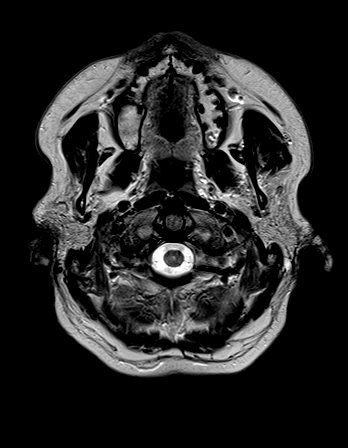
[im 24/24]
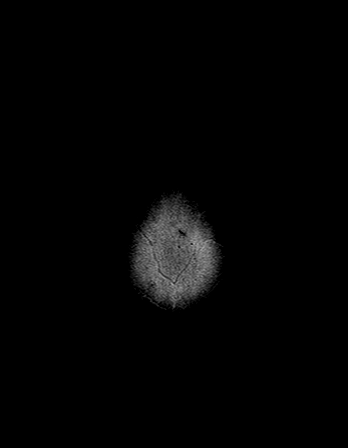

[Series 8: FLAIR · axial · 3.0mm · 0.45mm/px · z∈[-64,+84]mm · 2 of 33 slices shown]
[im 1/33]
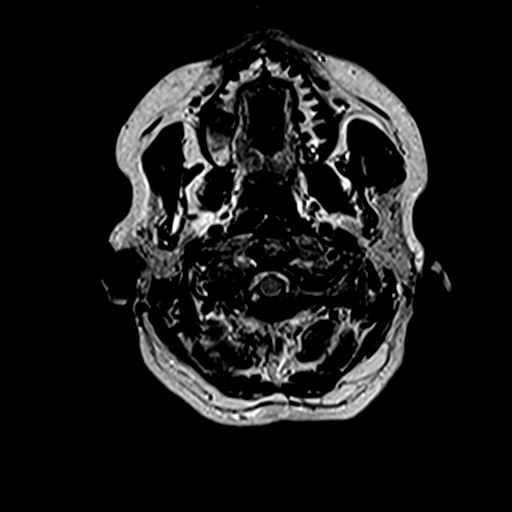
[im 33/33]
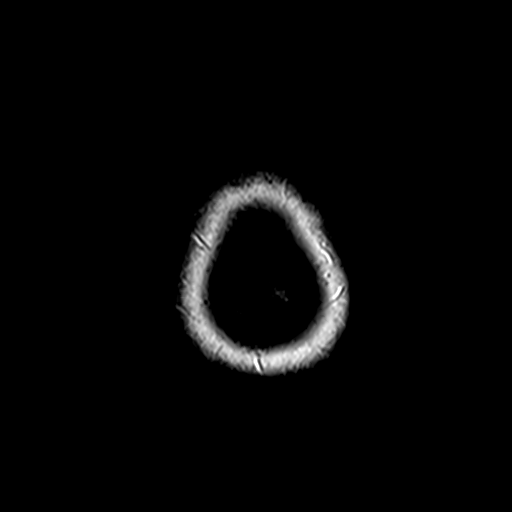

[Series 10: swi_images · axial · 4.0mm · 0.90mm/px · z∈[-70,+86]mm · 3 of 40 slices shown]
[im 1/40]
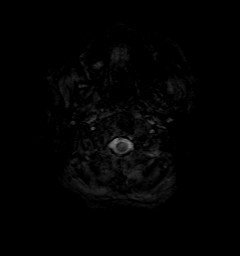
[im 20/40]
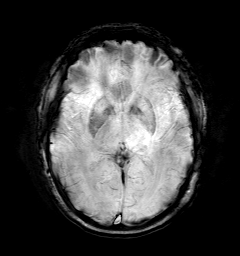
[im 40/40]
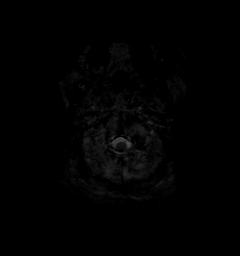

[Series 11: t1_mpr_tra · axial · 1.1mm · 0.75mm/px · z∈[-69,+82]mm · 10 of 144 slices shown (1 of 2)]
[im 1/144]
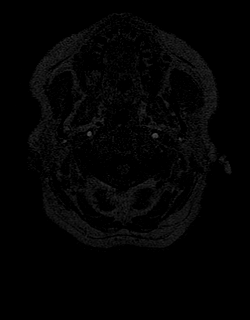
[im 16/144]
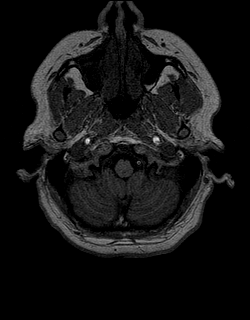
[im 32/144]
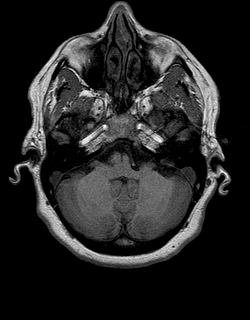
[im 48/144]
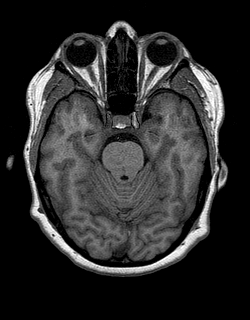
[im 64/144]
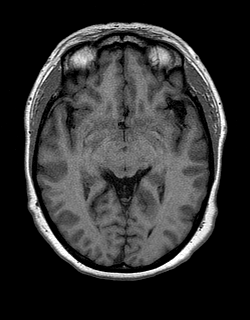
[im 80/144]
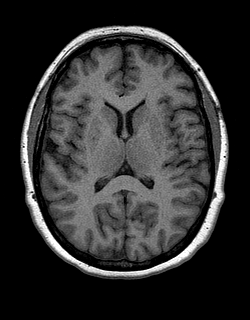
[im 96/144]
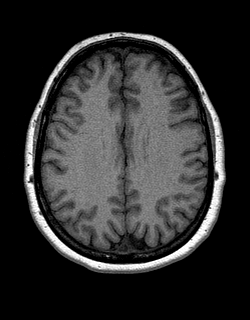
[im 112/144]
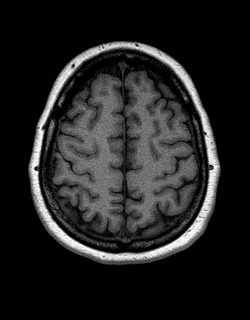
[im 128/144]
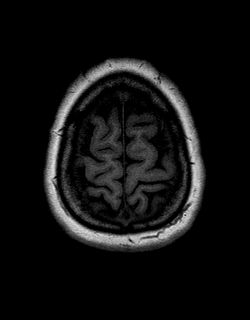
[im 144/144]
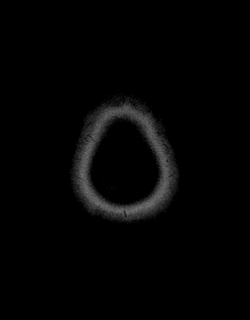

[Series 12: T2 · coronal · 5.0mm · 0.45mm/px · 2 of 27 slices shown (2 of 2)]
[im 1/27]
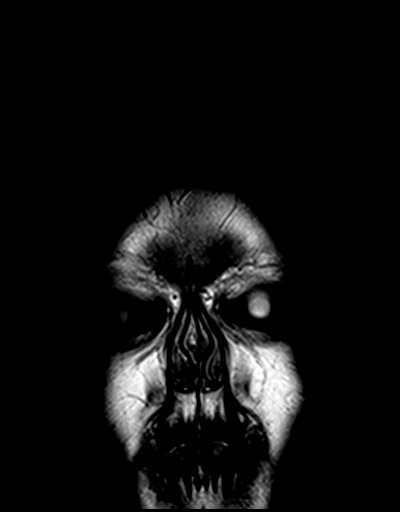
[im 27/27]
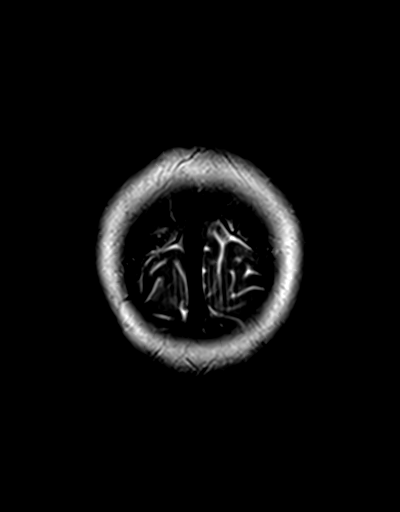

[Series 13: t1_mpr_tra · axial · 1.1mm · 0.75mm/px · z∈[-69,+82]mm · 10 of 144 slices shown (2 of 2)]
[im 1/144]
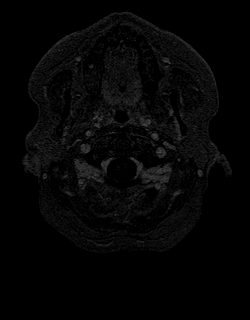
[im 16/144]
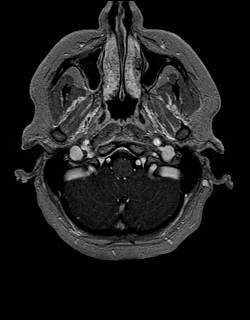
[im 32/144]
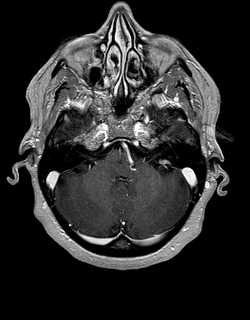
[im 48/144]
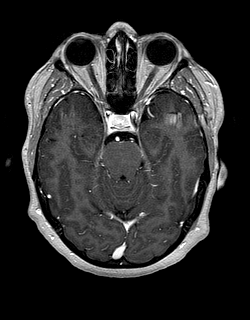
[im 64/144]
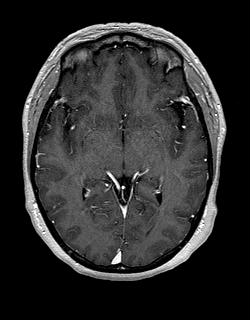
[im 80/144]
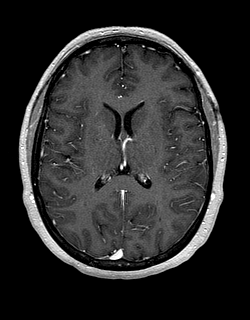
[im 96/144]
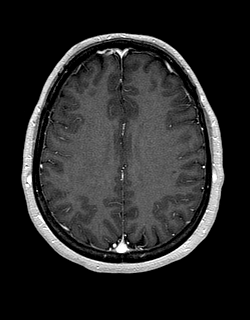
[im 112/144]
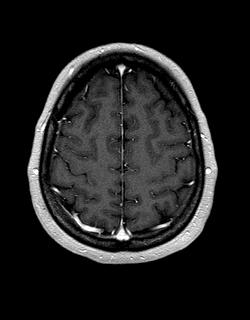
[im 128/144]
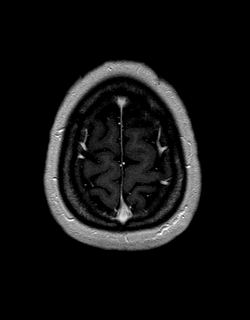
[im 144/144]
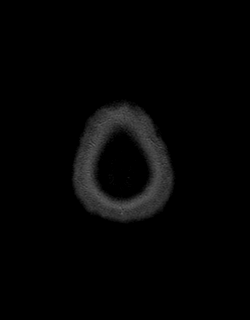

[Series 14: post cor · coronal · 5.0mm · 0.45mm/px · 2 of 27 slices shown]
[im 1/27]
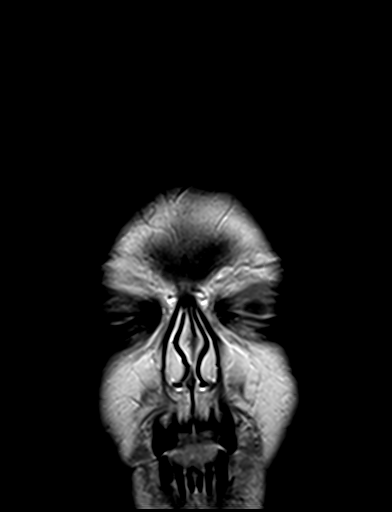
[im 27/27]
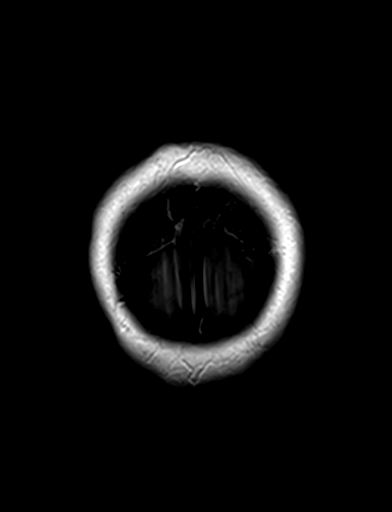

[48 of 48 positions shown; findings below may reference images not displayed]

FINDINGS: Brain: Normal cerebral volume. No restricted diffusion to suggest
acute infarction. No midline shift, evidence of mass lesion,
ventriculomegaly, extra-axial collection or acute intracranial
hemorrhage. Cervicomedullary junction and pituitary are within
normal limits.

Gray and white matter signal is within normal limits for age
throughout the brain. No encephalomalacia identified. No T2*/SWI
imaging provided. No abnormal enhancement identified. No dural
thickening.

Vascular: Major intracranial vascular flow voids are preserved.
Dominant and tortuous distal left vertebral artery with mass effect
on the left pontomedullary junction (series 7, image 6 and series
12, image 12). On series 13, image 31 it appears the left PICA
origin occurs at the area of mass effect which is at or near the
left 7th/8th cranial nerve root entry zone.

The major dural venous sinuses are enhancing and appear to be
patent.

Skull and upper cervical spine: Negative visible cervical spine.
Normal bone marrow signal.

Sinuses/Orbits: Negative orbits. Mild to moderate paranasal sinus
mucosal thickening, and mucous retention cyst is located in the
right maxillary alveolar recess. No sinus fluid levels.

Other: Mastoid air cells are clear. Aside from the left
pontomedullary junction findings described earlier the Visible
internal auditory structures appear normal. Normal stylomastoid
foramina. Scalp and face soft tissues appear negative.
IMPRESSION: 1. Tortuous Left Vertebral Artery and Left PICA origin exerting mass
effect on the left pontomedullary junction at or near the 7th/8th
cranial nerve root entry zone. Such an appearance can be associated
with cranial neuropathy.
2. Otherwise normal MRI appearance of the brain.

## 2021-12-03 DIAGNOSIS — F32A Depression, unspecified: Secondary | ICD-10-CM | POA: Insufficient documentation

## 2021-12-26 ENCOUNTER — Ambulatory Visit
Admission: EM | Admit: 2021-12-26 | Discharge: 2021-12-26 | Disposition: A | Discharge status: Home / Self Care | Payer: BC Managed Care – PPO | Attending: Urgent Care | Admitting: Urgent Care

## 2021-12-26 ENCOUNTER — Other Ambulatory Visit: Payer: Self-pay

## 2021-12-26 ENCOUNTER — Encounter: Payer: Self-pay | Admitting: Emergency Medicine

## 2021-12-26 DIAGNOSIS — R590 Localized enlarged lymph nodes: Secondary | ICD-10-CM | POA: Diagnosis not present

## 2021-12-26 DIAGNOSIS — J029 Acute pharyngitis, unspecified: Secondary | ICD-10-CM | POA: Insufficient documentation

## 2021-12-26 DIAGNOSIS — R070 Pain in throat: Secondary | ICD-10-CM | POA: Diagnosis present

## 2021-12-26 HISTORY — DX: Essential (primary) hypertension: I10

## 2021-12-26 LAB — POCT RAPID STREP A (OFFICE): Rapid Strep A Screen: NEGATIVE

## 2021-12-26 MED ORDER — IBUPROFEN 600 MG PO TABS
600.0000 mg | ORAL_TABLET | Freq: Three times a day (TID) | ORAL | 0 refills | Status: DC
Start: 2021-12-26 — End: 2022-05-17

## 2021-12-26 MED ORDER — CHLORASEPTIC 1.4 % MT LIQD: 1.0000 | Freq: Three times a day (TID) | OROMUCOSAL | 0 refills | Status: DC | PRN

## 2021-12-26 NOTE — ED Provider Notes (Signed)
[place]   MRN: [id number] DOB: 17-Aug-1966  Subjective:   Breanna Wang is a 56 y.o. female presenting for 2-day history of acute onset throat pain, painful swallowing, chills, left-sided neck pain.  No headache, runny or stuffy nose, vision changes, ear pain, cough, chest pain, shortness of breath, wheezing.  Patient's primary concern is for strep throat.  No sick contacts to her knowledge.  No current facility-administered medications for this encounter.  Current Outpatient Medications:    amlodipine-atorvastatin (CADUET) 10-10 MG tablet, Take 1 tablet by mouth daily., Disp: , Rfl:    metoprolol tartrate (LOPRESSOR) 50 MG tablet, Take 50 mg by mouth 2 (two) times daily., Disp: , Rfl:    No Known Allergies  Past Medical History:  Diagnosis Date   Hypertension      History reviewed. No pertinent surgical history.  History reviewed. No pertinent family history.  Social History   Tobacco Use   Smoking status: Former    Types: Cigarettes   Smokeless tobacco: Never  Substance Use Topics   Alcohol use: Never   Drug use: Never    ROS   Objective:   Vitals: BP (!) 150/90 (BP Location: Right Arm)    Pulse (!) 101    Temp 98.1 F (36.7 C) (Oral)    Resp 18    SpO2 96%   Physical Exam Constitutional:      General: She is not in acute distress.    Appearance: Normal appearance. She is well-developed and normal weight. She is not ill-appearing, toxic-appearing or diaphoretic.  HENT:     Head: Normocephalic and atraumatic.     Right Ear: Tympanic membrane, ear canal and external ear normal. No drainage or tenderness. No middle ear effusion. There is no impacted cerumen. Tympanic membrane is not erythematous.     Left Ear: Tympanic membrane, ear canal and external ear normal. No drainage or tenderness.  No middle ear effusion. There is no impacted cerumen. Tympanic membrane is not erythematous.     Nose: No congestion or rhinorrhea.      Mouth/Throat:     Mouth: Mucous membranes are moist. No oral lesions.     Pharynx: No pharyngeal swelling, oropharyngeal exudate, posterior oropharyngeal erythema or uvula swelling.     Tonsils: No tonsillar exudate or tonsillar abscesses.     Comments: Thick streaks of postnasal drainage overlying the pharynx. Eyes:     General: No scleral icterus.       Right eye: No discharge.        Left eye: No discharge.     Extraocular Movements: Extraocular movements intact.     Right eye: Normal extraocular motion.     Left eye: Normal extraocular motion.     Conjunctiva/sclera: Conjunctivae normal.  Cardiovascular:     Rate and Rhythm: Normal rate.  Pulmonary:     Effort: Pulmonary effort is normal.  Musculoskeletal:     Cervical back: Normal range of motion and neck supple.  Lymphadenopathy:     Cervical: No cervical adenopathy.  Skin:    General: Skin is warm and dry.  Neurological:     General: No focal deficit present.     Mental Status: She is alert and oriented to person, place, and time.  Psychiatric:        Mood and Affect: Mood normal.        Behavior: Behavior normal.    Results for orders placed or performed during the hospital encounter of 12/26/21 (from the past 24  hour(s))  POCT rapid strep A     Status: None   Collection Time: 12/26/21  1:57 PM  Result Value Ref Range   Rapid Strep A Screen Negative Negative    Assessment and Plan :   PDMP not reviewed this encounter.  1. Acute viral pharyngitis   2. Throat pain   3. Left cervical lymphadenopathy    Strep culture pending.  Recommend supportive care for an acute viral pharyngitis. Counseled patient on potential for adverse effects with medications prescribed/recommended today, ER and return-to-clinic precautions discussed, patient verbalized understanding.    Jaynee Eagles, PA-C 12/26/21 1410

## 2021-12-26 NOTE — ED Triage Notes (Signed)
Chills, sore throat since Friday.

## 2021-12-27 ENCOUNTER — Encounter: Payer: Self-pay | Admitting: Internal Medicine

## 2021-12-28 LAB — CULTURE, GROUP A STREP (THRC)

## 2021-12-29 ENCOUNTER — Telehealth: Payer: Self-pay | Admitting: Emergency Medicine

## 2021-12-29 MED ORDER — AZITHROMYCIN 250 MG PO TABS: 250.0000 mg | ORAL_TABLET | Freq: Every day | ORAL | 0 refills | Status: DC

## 2022-02-06 NOTE — Progress Notes (Deleted)
?Cardiology Office Note:   ? ?Date:  02/06/22 ? ?ID:  Breanna Wang, DOB 21-Jun-1966, MRN [id number] ? ?PCP:  Pcp, No  ?Cardiologist:  Elouise Munroe, MD  ?Electrophysiologist:  None  ? ?Referring MD: Breanna Sites, MD  ? ?Chief Complaint/Reason for Referral: ?Dizziness on antihypertensive therapy. ? ?History of Present Illness:   ? ?Breanna Wang is a 56 y.o. female with a history of history of HTN, no significant OSA unless supine, and perimenopausal who presents for follow up.   ? ?Received steroids for herniated disk and dizziness resolved. ? ?Stlil taking metoprolol***, rare use of lasix when needed. BP elevated most days, feels she needs further BP control.  ? ?The patient denies chest pain, chest pressure, dyspnea at rest or with exertion, palpitations, PND, orthopnea, or leg swelling. Denies cough, fever, chills. Denies nausea, vomiting. Denies syncope or presyncope. Denies dizziness or lightheadedness.  ? ?Past Medical History:  ?Diagnosis Date  ? Hypertension   ? ? ?Past Surgical History:  ?Procedure Laterality Date  ? COLONOSCOPY N/A 05/09/2018  ? Procedure: COLONOSCOPY;  Surgeon: Breanna Houston, MD;  Location: [facility];  Service: Endoscopy;  Laterality: N/A;  830  ? DILATION AND CURETTAGE OF UTERUS    ? POLYPECTOMY  05/09/2018  ? Procedure: POLYPECTOMY;  Surgeon: Breanna Houston, MD;  Location: [facility];  Service: Endoscopy;;  colon  ? TUBAL LIGATION    ? uterine ablation    ? ? ?Current Medications: ?No outpatient medications have been marked as taking for the 02/16/22 encounter (Appointment) with Elouise Munroe, MD.  ?  ? ?Allergies:   Patient has no known allergies.  ? ?Social History  ? ?Tobacco Use  ? Smoking status: Former  ?  Types: Cigarettes  ? Smokeless tobacco: Never  ? Tobacco comments:  ?  quit at age 25  ?Vaping Use  ? Vaping Use: Never used  ?Substance Use Topics  ? Alcohol use: Never  ?  Comment: occasionally  ? Drug use: Never  ?  ? ?Family History: ?The  patient's family history includes Cancer in her paternal grandmother; Diabetes in her father; High blood pressure in her brother, father, paternal grandfather, and paternal grandmother; Hypertension in her mother; Lung cancer in her father; Thyroid disease (age of onset: 24) in her mother. ? ?ROS:   ?Please see the history of present illness.    ?All other systems reviewed and are negative. ? ?EKGs/Labs/Other Studies Reviewed:   ? ?The following studies were reviewed today: ? ?EKG:  not obtained today. ?03/17/21: NSR ? ?Recent Labs: ?No results found for requested labs within last 8760 hours.  ?Recent Lipid Panel ?   ?Component Value Date/Time  ? CHOL 161 08/03/2021 0753  ? TRIG 102 08/03/2021 0753  ? HDL 49 08/03/2021 0753  ? CHOLHDL 3.3 08/03/2021 0753  ? [place] 93 08/03/2021 0753  ? ? ?Physical Exam:   ? ?VS:  LMP 09/04/2020    ? ?Wt Readings from Last 5 Encounters:  ?07/27/21 193 lb 3.2 oz (87.6 kg)  ?03/17/21 187 lb 9.6 oz (85.1 kg)  ?12/18/20 188 lb 9.6 oz (85.5 kg)  ?02/13/20 192 lb (87.1 kg)  ?12/20/19 192 lb 3.2 oz (87.2 kg)  ?  ?Constitutional: No acute distress ?Eyes: sclera non-icteric, normal conjunctiva and lids ?ENMT: normal dentition, moist mucous membranes ?Cardiovascular: regular rhythm, normal rate, no murmurs. S1 and S2 normal. Radial pulses normal bilaterally. No jugular venous distention.  ?Respiratory: clear to auscultation bilaterally ?GI :  normal bowel sounds, soft and nontender. No distention.   ?MSK: extremities warm, well perfused. No edema.  ?NEURO: grossly nonfocal exam, moves all extremities. ?PSYCH: alert and oriented x 3, normal mood and affect.  ? ? ?ASSESSMENT:   ? ?1. Dizziness   ?2. Essential hypertension   ?3. Palpitations   ?4. Medication management   ?5. Snoring   ?6. Excessive daytime sleepiness   ?7. Hyperlipidemia, unspecified hyperlipidemia type   ? ? ? ?PLAN:   ? ?Dizziness ?Essential hypertension  ?Palpitations ?Medication management ?- continues on metoprolol tartrate  25 mg BID. Palpitations are stable. Dizziness resolved.  ?- she is taking lasix 20 mg rarely prn as well.  ?- will resume amlodipine 5 mg daily. If LE swelling, will transition to losartan. ? ?Snoring ?Excessive daytime sleepiness ?- no significant OSA but did have positional, supine dependent moderate OSA. Recommended not to sleep on back. May need MLST if daytime sleepiness not improved. ? ?HLD -  ?LDL 93, cor cal score of 19, Amlodipine-atorvastatin combination pill is on med list now, she is tolerating 10 mg of atorvastatin well ***. ? ?Total time of encounter: ?30 minutes total time of encounter, including 20 minutes spent in face-to-face patient care on the date of this encounter. This time includes coordination of care and counseling regarding above mentioned problem list. Remainder of non-face-to-face time involved reviewing chart documents/testing relevant to the patient encounter and documentation in the medical record. I have independently reviewed documentation from referring provider.  ? ?Cherlynn Kaiser, MD, [facility] ?[place]  ? ? ? ?Medication Adjustments/Labs and Tests Ordered: ?Current medicines are reviewed at length with the patient today.  Concerns regarding medicines are outlined above.  ? ?No orders of the defined types were placed in this encounter. ? ? ? ?No orders of the defined types were placed in this encounter. ? ? ? ?There are no Patient Instructions on file for this visit.  ? ?

## 2022-02-16 ENCOUNTER — Ambulatory Visit: Payer: BC Managed Care – PPO | Admitting: Internal Medicine

## 2022-02-16 DIAGNOSIS — I10 Essential (primary) hypertension: Secondary | ICD-10-CM

## 2022-02-16 DIAGNOSIS — E785 Hyperlipidemia, unspecified: Secondary | ICD-10-CM

## 2022-02-16 DIAGNOSIS — R002 Palpitations: Secondary | ICD-10-CM

## 2022-02-16 DIAGNOSIS — G4719 Other hypersomnia: Secondary | ICD-10-CM

## 2022-02-16 DIAGNOSIS — Z79899 Other long term (current) drug therapy: Secondary | ICD-10-CM

## 2022-02-16 DIAGNOSIS — R0683 Snoring: Secondary | ICD-10-CM

## 2022-02-16 DIAGNOSIS — R42 Dizziness and giddiness: Secondary | ICD-10-CM

## 2022-05-17 ENCOUNTER — Encounter: Payer: Self-pay | Admitting: Internal Medicine

## 2022-05-17 ENCOUNTER — Other Ambulatory Visit: Payer: Self-pay | Admitting: Internal Medicine

## 2022-05-17 ENCOUNTER — Ambulatory Visit: Payer: BC Managed Care – PPO | Admitting: Internal Medicine

## 2022-05-17 VITALS — BP 134/90 | HR 78 | Ht 65.0 in | Wt 188.2 lb

## 2022-05-17 DIAGNOSIS — R0683 Snoring: Secondary | ICD-10-CM

## 2022-05-17 DIAGNOSIS — R42 Dizziness and giddiness: Secondary | ICD-10-CM

## 2022-05-17 DIAGNOSIS — I10 Essential (primary) hypertension: Secondary | ICD-10-CM

## 2022-05-17 DIAGNOSIS — E785 Hyperlipidemia, unspecified: Secondary | ICD-10-CM

## 2022-05-17 DIAGNOSIS — G4719 Other hypersomnia: Secondary | ICD-10-CM

## 2022-05-17 DIAGNOSIS — Z79899 Other long term (current) drug therapy: Secondary | ICD-10-CM

## 2022-05-17 DIAGNOSIS — R002 Palpitations: Secondary | ICD-10-CM

## 2022-05-17 MED ORDER — ROSUVASTATIN CALCIUM 5 MG PO TABS: 5.0000 mg | ORAL_TABLET | Freq: Every day | ORAL | 3 refills | Status: DC

## 2022-05-17 MED ORDER — AMLODIPINE BESYLATE 5 MG PO TABS: 5.0000 mg | ORAL_TABLET | Freq: Every day | ORAL | 3 refills | Status: DC

## 2022-05-17 NOTE — Progress Notes (Signed)
Cardiology Office Note:    Date:  05/17/2022  ID:  [name], DOB [birth year], MRN [id number]  PCP:  Pcp, No  Cardiologist:  Elouise Munroe, MD  Electrophysiologist:  None   Referring MD: Sharilyn Sites, MD   Chief Complaint/Reason for Referral: HTN  History of Present Illness:    Breanna Wang is a 56 y.o. female with a history of history of HTN, no significant OSA unless supine, and perimenopausal who presents for follow up.    Dizziness resolved. Still taking metoprolol, rare use of lasix when needed. Continues on amlodipine with adequate BP control.  The patient denies chest pain, chest pressure, dyspnea at rest or with exertion, palpitations, PND, orthopnea, or leg swelling. Denies cough, fever, chills. Denies nausea, vomiting. Denies syncope or presyncope. Denies dizziness or lightheadedness.   Past Medical History:  Diagnosis Date   Hypertension     Past Surgical History:  Procedure Laterality Date   COLONOSCOPY N/A 05/09/2018   Procedure: COLONOSCOPY;  Surgeon: Rogene Houston, MD;  [facility];  Service: Endoscopy;  Laterality: N/A;  [place]     POLYPECTOMY  05/09/2018   Procedure: POLYPECTOMY;  Surgeon: Rogene Houston, MD;  [facility];  Service: Endoscopy;;  colon   TUBAL LIGATION     uterine ablation      Current Medications: Current Meds  Medication Sig   FLUoxetine (PROZAC) 20 MG capsule Take 20 mg by mouth daily.   metoprolol tartrate (LOPRESSOR) 50 MG tablet Take 50 mg by mouth 2 (two) times daily.   rosuvastatin (CRESTOR) 5 MG tablet Take 1 tablet (5 mg total) by mouth daily.   [DISCONTINUED] amLODipine (NORVASC) 5 MG tablet Take 1 tablet (5 mg total) by mouth daily.   [DISCONTINUED] amlodipine-atorvastatin (CADUET) 10-10 MG tablet Take 1 tablet by mouth daily.     Allergies:   Patient has no known allergies.   Social History   Tobacco Use   Smoking status: Former     Types: Cigarettes   Smokeless tobacco: Never   Tobacco comments:    quit at age 68  Vaping Use   Vaping Use: Never used  Substance Use Topics   Alcohol use: Never    Comment: occasionally   Drug use: Never     Family History: The patient's family history includes Cancer in her paternal grandmother; Diabetes in her father; High blood pressure in her brother, father, paternal grandfather, and paternal grandmother; Hypertension in her mother; Lung cancer in her father; Thyroid disease (age of onset: 19) in her mother.  ROS:   Please see the history of present illness.    All other systems reviewed and are negative.  EKGs/Labs/Other Studies Reviewed:    The following studies were reviewed today:  EKG:  05/17/22: NSR  03/17/21: NSR  Recent Labs: No results found for requested labs within last 365 days.  Recent Lipid Panel    Component Value Date/Time   CHOL 161 08/03/2021 0753   TRIG 102 08/03/2021 0753   HDL 49 08/03/2021 0753   CHOLHDL 3.3 08/03/2021 0753   LDLCALC 93 08/03/2021 0753    Physical Exam:    VS:  BP 134/90   Pulse 78   Ht '5\' 5"'$  (1.651 m)   Wt 188 lb 3.2 oz (85.4 kg)   LMP 09/04/2020   SpO2 96%   BMI 31.32 kg/m     Wt Readings from Last 5 Encounters:  05/17/22  188 lb 3.2 oz (85.4 kg)  07/27/21 193 lb 3.2 oz (87.6 kg)  03/17/21 187 lb 9.6 oz (85.1 kg)  12/18/20 188 lb 9.6 oz (85.5 kg)  02/13/20 192 lb (87.1 kg)    Constitutional: No acute distress Eyes: sclera non-icteric, normal conjunctiva and lids ENMT: normal dentition, moist mucous membranes Cardiovascular: regular rhythm, normal rate, no murmurs. S1 and S2 normal. Radial pulses normal bilaterally. No jugular venous distention.  Respiratory: clear to auscultation bilaterally GI : normal bowel sounds, soft and nontender. No distention.   MSK: extremities warm, well perfused. No edema.  NEURO: grossly nonfocal exam, moves all extremities. PSYCH: alert and oriented x 3, normal mood and affect.     ASSESSMENT:    1. Dyslipidemia, goal LDL below 70   2. Essential hypertension   3. Dizziness   4. Palpitations   5. Medication management   6. Snoring   7. Excessive daytime sleepiness      PLAN:    Dizziness Essential hypertension  Palpitations Medication management - continues on metoprolol tartrate 25 mg BID. Palpitations are stable. Dizziness resolved.  - she is taking lasix 20 mg rarely prn as well.  - amlodipine 5 mg daily  Snoring Excessive daytime sleepiness - no significant OSA but did have positional, supine dependent moderate OSA. Recommended not to sleep on back. May need MLST if daytime sleepiness not improved.  HLD -  LDL 97, cor cal score of 19, though ASCVD risk score is low we participated in shared decision making an decided we will active preventively and initiate statin with crestor 5 mg daily given coronary calcifications for secondary prevention.  The 10-year ASCVD risk score (Arnett DK, et al., 2019) is: 2.4%   Values used to calculate the score:     Age: 37 years     Sex: Female     Is Non-Hispanic African American: No     Diabetic: No     Tobacco smoker: No     Systolic Blood Pressure: 119 mmHg     Is BP treated: Yes     HDL Cholesterol: 49 mg/dL     Total Cholesterol: 161 mg/dL   Total time of encounter: 30 minutes total time of encounter, including 20 minutes spent in face-to-face patient care on the date of this encounter. This time includes coordination of care and counseling regarding above mentioned problem list. Remainder of non-face-to-face time involved reviewing chart documents/testing relevant to the patient encounter and documentation in the medical record. I have independently reviewed documentation from referring provider.   Cherlynn Kaiser, MD, [facility]     Medication Adjustments/Labs and Tests Ordered: Current medicines are reviewed at length with the patient today.  Concerns regarding medicines  are outlined above.   Orders Placed This Encounter  Procedures   Lipoprotein A (LPA)   Lipid panel   Hepatic function panel   Apolipoprotein B   EKG 12-Lead     Meds ordered this encounter  Medications   rosuvastatin (CRESTOR) 5 MG tablet    Sig: Take 1 tablet (5 mg total) by mouth daily.    Dispense:  90 tablet    Refill:  3   amLODipine (NORVASC) 5 MG tablet    Sig: Take 1 tablet (5 mg total) by mouth daily.    Dispense:  90 tablet    Refill:  3     Patient Instructions  Medication Instructions:  START rosuvastatin (crestor) '5mg'$  daily  *If you need a  refill on your cardiac medications before your next appointment, please call your pharmacy*   Lab Work: FASTING lab work to check cholesterol and liver enzymes in about 3 months -- late September/early October 2023 If you have labs (blood work) drawn today and your tests are completely normal, you will receive your results only by: [place] (if you have MyChart) OR A paper copy in the mail If you have any lab test that is abnormal or we need to change your treatment, we will call you to review the results.   Testing/Procedures: NONE   Follow-Up: At [facility], you and your health needs are our priority.  As part of our continuing mission to provide you with exceptional heart care, we have created designated Provider Care Teams.  These Care Teams include your primary Cardiologist (physician) and Advanced Practice Providers (APPs -  Physician Assistants and Nurse Practitioners) who all work together to provide you with the care you need, when you need it.  We recommend signing up for the patient portal called "MyChart".  Sign up information is provided on this After Visit Summary.  MyChart is used to connect with patients for Virtual Visits (Telemedicine).  Patients are able to view lab/test results, encounter notes, upcoming appointments, etc.  Non-urgent messages can be sent to your provider as well.   To  learn more about what you can do with MyChart, go to [email or web address].    Your next appointment:   12 month(s)  The format for your next appointment:   In Person  Provider:   Elouise Munroe, MD {

## 2022-08-29 ENCOUNTER — Encounter: Payer: Self-pay | Admitting: Internal Medicine

## 2022-08-29 MED ORDER — METOPROLOL TARTRATE 25 MG PO TABS: 25.0000 mg | ORAL_TABLET | Freq: Two times a day (BID) | ORAL | 2 refills | Status: DC

## 2022-09-10 LAB — LIPID PANEL
Chol/HDL Ratio: 2.6 ratio (ref 0.0–4.4)
Cholesterol, Total: 114 mg/dL (ref 100–199)
HDL: 44 mg/dL (ref >39)
LDL Chol Calc (NIH): 55 mg/dL (ref 0–99)
Triglycerides: 69 mg/dL (ref 0–149)
VLDL Cholesterol Cal: 15 mg/dL (ref 5–40)

## 2022-09-10 LAB — HEPATIC FUNCTION PANEL
ALT: 15 IU/L (ref 0–32)
AST: 18 IU/L (ref 0–40)
Albumin: 4.4 g/dL (ref 3.8–4.9)
Alkaline Phosphatase: 64 IU/L (ref 44–121)
Bilirubin Total: 0.3 mg/dL (ref 0.0–1.2)
Bilirubin, Direct: 0.12 mg/dL (ref 0.00–0.40)
Total Protein: 6.7 g/dL (ref 6.0–8.5)

## 2022-09-10 LAB — APOLIPOPROTEIN B: Apolipoprotein B: 55 mg/dL (ref <90)

## 2022-09-10 LAB — LIPOPROTEIN A (LPA): Lipoprotein (a): 15 nmol/L (ref <75.0)

## 2023-07-18 ENCOUNTER — Other Ambulatory Visit: Payer: Self-pay | Admitting: Internal Medicine

## 2023-07-31 ENCOUNTER — Ambulatory Visit: Payer: BC Managed Care – PPO | Attending: Internal Medicine | Admitting: Internal Medicine

## 2023-07-31 ENCOUNTER — Encounter: Payer: Self-pay | Admitting: Internal Medicine

## 2023-07-31 VITALS — BP 130/90 | HR 81 | Ht 65.0 in | Wt 189.0 lb

## 2023-07-31 DIAGNOSIS — E785 Hyperlipidemia, unspecified: Secondary | ICD-10-CM | POA: Diagnosis not present

## 2023-07-31 DIAGNOSIS — R002 Palpitations: Secondary | ICD-10-CM | POA: Diagnosis not present

## 2023-07-31 DIAGNOSIS — Z6831 Body mass index (BMI) 31.0-31.9, adult: Secondary | ICD-10-CM | POA: Diagnosis not present

## 2023-07-31 DIAGNOSIS — I10 Essential (primary) hypertension: Secondary | ICD-10-CM

## 2023-07-31 MED ORDER — AMLODIPINE BESYLATE 5 MG PO TABS: 5.0000 mg | ORAL_TABLET | Freq: Every day | ORAL | 3 refills | Status: DC

## 2023-07-31 MED ORDER — METOPROLOL TARTRATE 25 MG PO TABS: 25.0000 mg | ORAL_TABLET | Freq: Two times a day (BID) | ORAL | 3 refills | Status: DC

## 2023-07-31 NOTE — Progress Notes (Signed)
Cardiology Office Note:    Date:  07/31/2023  ID:  Breanna Wang, DOB November 01, 1966, MRN [id number]  PCP:  Pcp, No  Cardiologist:  Parke Poisson, MD  Electrophysiologist:  None   Referring MD: No ref. provider found   Chief Complaint/Reason for Referral: HTN  History of Present Illness:    Breanna Wang is a 57 y.o. female with a history of history of HTN, no significant OSA unless supine, here for follow-up.  Dizziness felt to be post-COVID related, this has resolved.  Palpitations improved and are rare.  She feels weight management has been a challenge and we discussed GLP-1 agonist for weight loss which may also contribute to better blood pressure control.  We discussed increasing blood pressure medications today but we will trial a strategy of observation and blood pressure monitoring, and will also pursue a weight loss strategy which may benefit.  Stable on current medications.  Will refill these today.   Past Medical History:  Diagnosis Date   Hypertension     Past Surgical History:  Procedure Laterality Date   COLONOSCOPY N/A 05/09/2018   Procedure: COLONOSCOPY;  Surgeon: Malissa Hippo, MD;  [facility];  Service: Endoscopy;  Laterality: N/A;  830   DILATION AND CURETTAGE OF UTERUS     POLYPECTOMY  05/09/2018   Procedure: POLYPECTOMY;  Surgeon: Malissa Hippo, MD;  [facility];  Service: Endoscopy;;  colon   TUBAL LIGATION     uterine ablation      Current Medications: Current Meds  Medication Sig   amLODipine (NORVASC) 5 MG tablet Take 1 tablet (5 mg total) by mouth daily.   FLUoxetine (PROZAC) 20 MG capsule Take 20 mg by mouth daily.   meloxicam (MOBIC) 7.5 MG tablet Take by mouth as needed.   metoprolol tartrate (LOPRESSOR) 25 MG tablet Take 1 tablet (25 mg total) by mouth 2 (two) times daily.     Allergies:   Patient has no known allergies.   Social History   Tobacco Use   Smoking status: Former    Types:  Cigarettes   Smokeless tobacco: Never   Tobacco comments:    quit at age 55  Vaping Use   Vaping status: Never Used  Substance Use Topics   Alcohol use: Never    Comment: occasionally   Drug use: Never     Family History: The patient's family history includes Cancer in her paternal grandmother; Diabetes in her father; High blood pressure in her brother, father, paternal grandfather, and paternal grandmother; Hypertension in her mother; Lung cancer in her father; Thyroid disease (age of onset: 32) in her mother.  ROS:   Please see the history of present illness.    All other systems reviewed and are negative.  EKGs/Labs/Other Studies Reviewed:    The following studies were reviewed today:  EKG:  EKG Interpretation Date/Time:  Monday July 31 2023 08:52:06 EDT Ventricular Rate:  81 PR Interval:  146 QRS Duration:  86 QT Interval:  404 QTC Calculation: 469 R Axis:   14  Text Interpretation: Normal sinus rhythm Possible Left atrial enlargement Nonspecific ST abnormality Confirmed by Weston Brass ([id number]) on 07/31/2023 9:01:04 AM   05/17/22: NSR, nonspecific ST abnl 03/17/21: NSR  Recent Labs: 09/09/2022: ALT 15  Recent Lipid Panel    Component Value Date/Time   CHOL 114 09/09/2022 0745   TRIG 69 09/09/2022 0745   HDL 44 09/09/2022 0745   CHOLHDL 2.6 09/09/2022 0745  LDLCALC 55 09/09/2022 0745    Physical Exam:    VS:  BP (!) 130/90   Pulse 81   Ht 5\' 5"  (1.651 m)   Wt 189 lb (85.7 kg)   LMP 09/04/2020   SpO2 97%   BMI 31.45 kg/m     Wt Readings from Last 5 Encounters:  07/31/23 189 lb (85.7 kg)  05/17/22 188 lb 3.2 oz (85.4 kg)  07/27/21 193 lb 3.2 oz (87.6 kg)  03/17/21 187 lb 9.6 oz (85.1 kg)  12/18/20 188 lb 9.6 oz (85.5 kg)    Constitutional: No acute distress Eyes: sclera non-icteric, normal conjunctiva and lids ENMT: normal dentition, moist mucous membranes, no carotid bruit Cardiovascular: regular rhythm, normal rate, no murmurs. S1 and S2  normal. Radial pulses normal bilaterally. No jugular venous distention.  Respiratory: clear to auscultation bilaterally GI : normal bowel sounds, soft and nontender. No distention.   MSK: extremities warm, well perfused. No edema.  NEURO: grossly nonfocal exam, moves all extremities. PSYCH: alert and oriented x 3, normal mood and affect.    ASSESSMENT:    1. Palpitations   2. Essential hypertension   3. Dyslipidemia, goal LDL below 70   4. BMI 31.0-31.9,adult      PLAN:    Dizziness Essential hypertension  Palpitations Medication management - continues on metoprolol tartrate 25 mg BID. Palpitations are stable. Dizziness resolved.  - amlodipine 5 mg daily - No longer taking Lasix  Snoring Excessive daytime sleepiness BMI 31 - no significant OSA but did have positional, supine dependent moderate OSA. Recommended not to sleep on back. May need MLST if daytime sleepiness not improved.  This may also improve with weight loss, will pursue GLP-1 agonist with pharmacist.  HLD -  LDL 55 while on Crestor, cor cal score of 19.  She did not tolerate Crestor due to bladder irritation.  She will be attending a pharmacist visit for GLP-1 agonist and may want to consider conversation about PCSK9 inhibitor or SI RNA at this visit or follow-up visits with the pharmacist.  Repeat lipids and A1c today.  Total time of encounter: 30 minutes total time of encounter, including 20 minutes spent in face-to-face patient care on the date of this encounter. This time includes coordination of care and counseling regarding above mentioned problem list. Remainder of non-face-to-face time involved reviewing chart documents/testing relevant to the patient encounter and documentation in the medical record. I have independently reviewed documentation from referring provider.   Weston Brass, MD, [facility]  [facility]     Medication Adjustments/Labs and Tests Ordered: Current medicines are  reviewed at length with the patient today.  Concerns regarding medicines are outlined above.   Orders Placed This Encounter  Procedures   EKG 12-Lead   EKG 12-Lead     No orders of the defined types were placed in this encounter.    There are no Patient Instructions on file for this visit.

## 2023-07-31 NOTE — Patient Instructions (Signed)
Medication Instructions:  NO CHANGES  *If you need a refill on your cardiac medications before your next appointment, please call your pharmacy*   Lab Work: Lipid Panel and A1c today   If you have labs (blood work) drawn today and your tests are completely normal, you will receive your results only by: MyChart Message (if you have MyChart) OR A paper copy in the mail If you have any lab test that is abnormal or we need to change your treatment, we will call you to review the results.   Follow-Up: At [facility], you and your health needs are our priority.  As part of our continuing mission to provide you with exceptional heart care, we have created designated Provider Care Teams.  These Care Teams include your primary Cardiologist (physician) and Advanced Practice Providers (APPs -  Physician Assistants and Nurse Practitioners) who all work together to provide you with the care you need, when you need it.  We recommend signing up for the patient portal called "MyChart".  Sign up information is provided on this After Visit Summary.  MyChart is used to connect with patients for Virtual Visits (Telemedicine).  Patients are able to view lab/test results, encounter notes, upcoming appointments, etc.  Non-urgent messages can be sent to your provider as well.   To learn more about what you can do with MyChart, go to [email or web address].    Your next appointment:   12 months with Dr. Jacques Navy  Other Instructions You have been referred to our Cardiovascular [facility] -- clinical pharmacy team to discuss: - weight management - lipid management

## 2023-08-01 LAB — HEMOGLOBIN A1C
Est. average glucose Bld gHb Est-mCnc: 114 mg/dL
Hgb A1c MFr Bld: 5.6 % (ref 4.8–5.6)

## 2023-08-01 LAB — LIPID PANEL
Chol/HDL Ratio: 3.7 ratio (ref 0.0–4.4)
Cholesterol, Total: 212 mg/dL — ABNORMAL HIGH (ref 100–199)
HDL: 57 mg/dL (ref >39)
LDL Chol Calc (NIH): 127 mg/dL — ABNORMAL HIGH (ref 0–99)
Triglycerides: 161 mg/dL — ABNORMAL HIGH (ref 0–149)
VLDL Cholesterol Cal: 28 mg/dL (ref 5–40)

## 2023-08-01 NOTE — Progress Notes (Signed)
Order(s) created erroneously. Erroneous order ID: [id number]  Order canceled by: Enrique Sack  Order cancel date/time: 08/01/2023 11:50 AM

## 2023-08-15 ENCOUNTER — Telehealth: Payer: Self-pay | Admitting: Pharmacist

## 2023-08-15 ENCOUNTER — Telehealth: Payer: Self-pay

## 2023-08-15 ENCOUNTER — Ambulatory Visit: Payer: BC Managed Care – PPO | Attending: Cardiovascular Disease | Admitting: Pharmacist

## 2023-08-15 ENCOUNTER — Other Ambulatory Visit (HOSPITAL_COMMUNITY): Payer: Self-pay

## 2023-08-15 ENCOUNTER — Encounter: Payer: Self-pay | Admitting: Pharmacist

## 2023-08-15 VITALS — Wt 187.0 lb

## 2023-08-15 DIAGNOSIS — R931 Abnormal findings on diagnostic imaging of heart and coronary circulation: Secondary | ICD-10-CM

## 2023-08-15 DIAGNOSIS — E785 Hyperlipidemia, unspecified: Secondary | ICD-10-CM

## 2023-08-15 DIAGNOSIS — I2510 Atherosclerotic heart disease of native coronary artery without angina pectoris: Secondary | ICD-10-CM

## 2023-08-15 DIAGNOSIS — T466X5A Adverse effect of antihyperlipidemic and antiarteriosclerotic drugs, initial encounter: Secondary | ICD-10-CM | POA: Diagnosis not present

## 2023-08-15 MED ORDER — REPATHA SURECLICK 140 MG/ML ~~LOC~~ SOAJ: 1.0000 mL | SUBCUTANEOUS | 5 refills | Status: DC

## 2023-08-15 NOTE — Telephone Encounter (Signed)
LMOM regarding PA's for Repatha nd Wegovy

## 2023-08-15 NOTE — Telephone Encounter (Signed)
PA request for Repatha has been started. New Encounter created for follow up. For additional info see Pharmacy Prior Auth telephone encounter from 08/15/23.  Wegovy no PA required, patient basically pays the cost of drug

## 2023-08-15 NOTE — Addendum Note (Signed)
Addended by: Cheree Ditto on: 08/15/2023 05:11 PM   Modules accepted: Orders

## 2023-08-15 NOTE — Progress Notes (Signed)
Patient ID: Breanna Wang                 DOB: 05-18-1966                    MRN: [id number]     HPI: Breanna Wang is a 57 y.o. female patient referred to lipid clinic by Dr Jacques Navy. PMH is significant for HTN, HLD, and elevated coronary calcium score.   Patient presents today with husband. Previously on rosuvastatin 5mg  however it caused bladder irritation and UTIs. Symptoms persisted until medication was discontinued.  Has changed her diet this week and is now attempting a low carb diet. Is going to start attending the gym as well.   Current Medications: N/A  Intolerances:  Rosuvastatin (bladder irritation)  Risk Factors:  HLD CAD  LDL goal: <70  Labs: TC 212, Trigs 161, HDL 57, LDL 127 (07/31/23)  Imaging: Coronary calcium score of 19. This was 3 percentile for age and sex matched control. Coronary calcifications are seen in the mid LAD and proximal L circumflex.  Past Medical History:  Diagnosis Date   Hypertension     Current Outpatient Medications on File Prior to Visit  Medication Sig Dispense Refill   amLODipine (NORVASC) 5 MG tablet Take 1 tablet (5 mg total) by mouth daily. 90 tablet 3   estradiol (ESTRACE) 0.1 MG/GM vaginal cream Place 1 Applicatorful vaginally 2 (two) times a week.     FLUoxetine (PROZAC) 20 MG capsule Take 20 mg by mouth daily.     meloxicam (MOBIC) 7.5 MG tablet Take by mouth as needed. (Patient not taking: Reported on 07/31/2023)     metoprolol tartrate (LOPRESSOR) 25 MG tablet Take 1 tablet (25 mg total) by mouth 2 (two) times daily. 180 tablet 3   No current facility-administered medications on file prior to visit.    Allergies  Allergen Reactions   Crestor [Rosuvastatin]     Bladder irritation     Assessment/Plan:  1. Hyperlipidemia - Patient last LDL 127 which is above goal of <70. Increased from 55 on while on Crestor. Recommend starting PCSK9i. Using demo pen, educated on mechanism of action, storage, site selection,  administration, and possible adverse effects. Will complete PA and contact patient with result. Recheck lipid panel in 2-3 months.  2. Weight loss - Patient BMI 31.12 kg/m2 placing her in class 1 obesity category. Not diabetic. Using demo pens of Wegovy and Zepbound, explained mechanism of action, storage, administration, and possible adverse effects. Will complete BMI and contact patient with result.  Laural Golden, PharmD, BCACP, CDCES, CPP [address], [address] [place], [place], [address] Phone: [phone], Fax: [phone]

## 2023-08-15 NOTE — Patient Instructions (Addendum)
It was nice meeting you two today  We would like your LDL (bad cholesterol) to be less than 70  The medication we are recommending is called Repatha which is an injection you would take once every 2 weeks  I will complete the prior authorization for you and contact you with the result.  I will also complete the prior authorizations for Wegovy and Zepbound  Once you start the Repatha we will recheck your fasting lipid panel in 2-3 months  Please let us know if you have any questions  Laural Golden, PharmD, BCACP, CDCES, CPP [address] [place], [place], [address] Phone: [phone], Fax: [phone]

## 2023-08-15 NOTE — Telephone Encounter (Signed)
Patient called back. Advised Repatha approved and Breanna Wang also approved but very expensive. Recommended downloading copay card from manufacturers website and will place order for fasting lipid panel. Patient voiced understanding.

## 2023-08-15 NOTE — Telephone Encounter (Signed)
Please complete PA for Repatha and [facility]

## 2023-08-15 NOTE — Telephone Encounter (Signed)
Pharmacy Patient Advocate Encounter  Received notification from [facility] that Prior Authorization for REPATHA has been APPROVED from 08/15/23 to 08/14/24. Ran test claim, Copay is $30. This test claim was processed through [facility]- copay amounts may vary at other pharmacies due to pharmacy/plan contracts, or as the patient moves through the different stages of their insurance plan.

## 2023-08-15 NOTE — Telephone Encounter (Signed)
Pharmacy Patient Advocate Encounter   Received notification from Physician's Office that prior authorization for REPATHA is required/requested.   Insurance verification completed.   The patient is insured through [facility] .   Per test claim: PA required; PA submitted to [facility] via CoverMyMeds Key/confirmation #/EOC [facility] Status is pending

## 2023-10-02 ENCOUNTER — Encounter: Payer: Self-pay | Admitting: Internal Medicine

## 2023-10-03 NOTE — Telephone Encounter (Signed)
There is no evidence reported of Repatha causing tachycardia.

## 2024-01-22 ENCOUNTER — Other Ambulatory Visit: Payer: Self-pay | Admitting: Internal Medicine

## 2024-01-22 DIAGNOSIS — E785 Hyperlipidemia, unspecified: Secondary | ICD-10-CM

## 2024-01-22 DIAGNOSIS — I2510 Atherosclerotic heart disease of native coronary artery without angina pectoris: Secondary | ICD-10-CM

## 2024-01-22 DIAGNOSIS — R931 Abnormal findings on diagnostic imaging of heart and coronary circulation: Secondary | ICD-10-CM

## 2024-01-31 ENCOUNTER — Telehealth: Payer: Self-pay | Admitting: Internal Medicine

## 2024-01-31 NOTE — Telephone Encounter (Signed)
 Pt requesting cb regarding lab orders. States she was supposed to have done due to being on Repatha also wanted to know if she ntbs or just have labs done.

## 2024-01-31 NOTE — Telephone Encounter (Signed)
 Called pt. Verified full name and DOB. Informed patient lab orders are placed and she can go to any [facility] location to have her labs drawn. She will need to fast prior to lab draw. Patient verbalized understanding.   Josie LPN

## 2024-01-31 NOTE — Telephone Encounter (Signed)
 Her lab orders have already been placed. Just needs to come in while fasting. No appointment needed

## 2024-01-31 NOTE — Telephone Encounter (Signed)
*  STAT* If patient is at the pharmacy, call can be transferred to refill team.   1. Which medications need to be refilled? (please list name of each medication and dose if known) Evolocumab (REPATHA SURECLICK) 140 MG/ML SOAJ   2. Which pharmacy/location (including street and city if local pharmacy) is medication to be sent to? [facility] [place], [place] - [id number]   3. Do they need a 30 day or 90 day supply? 90

## 2024-02-08 LAB — LIPID PANEL
Chol/HDL Ratio: 2.2 ratio (ref 0.0–4.4)
Cholesterol, Total: 119 mg/dL (ref 100–199)
HDL: 53 mg/dL (ref >39)
LDL Chol Calc (NIH): 51 mg/dL (ref 0–99)
Triglycerides: 75 mg/dL (ref 0–149)
VLDL Cholesterol Cal: 15 mg/dL (ref 5–40)

## 2024-04-08 ENCOUNTER — Ambulatory Visit: Payer: Self-pay | Admitting: Internal Medicine

## 2024-04-12 NOTE — Telephone Encounter (Signed)
 LVM and MC msg sent. 04/12/24

## 2024-05-21 ENCOUNTER — Encounter: Payer: Self-pay | Admitting: *Deleted

## 2024-05-21 DIAGNOSIS — I10 Essential (primary) hypertension: Secondary | ICD-10-CM

## 2024-06-04 MED ORDER — AMLODIPINE BESYLATE 5 MG PO TABS: 5.0000 mg | ORAL_TABLET | Freq: Every day | ORAL | 1 refills | Status: DC

## 2024-06-04 NOTE — Telephone Encounter (Signed)
 Spoke to patient she stated she takes Amlodipine  5 mg daily.Stated she might have took twice a day by mistake,but she is not really sure if she took twice a day.Stated her B/P has been good.No swelling in lower legs and feet.Advised I will send refill to her pharmacy.Advised to keep appointment as planned.

## 2024-06-27 ENCOUNTER — Other Ambulatory Visit: Payer: Self-pay | Admitting: *Deleted

## 2024-06-27 ENCOUNTER — Other Ambulatory Visit (HOSPITAL_COMMUNITY): Payer: Self-pay

## 2024-06-27 DIAGNOSIS — I10 Essential (primary) hypertension: Secondary | ICD-10-CM

## 2024-06-27 MED ORDER — AMLODIPINE BESYLATE 5 MG PO TABS
5.0000 mg | ORAL_TABLET | Freq: Every day | ORAL | 1 refills | Status: DC
  Filled 2024-06-27 – 2024-07-10 (×2): qty 90, 90d supply, fill #0

## 2024-06-27 NOTE — Progress Notes (Signed)
 Called and made patient aware that per Dr. Loni r it is ok to refill amlodipine  5mg  and sent to [facility] at [address] Patient is requesting medication to mailed to her home. Made patient aware to call office for any questions. Understanding verbalized.

## 2024-07-10 ENCOUNTER — Other Ambulatory Visit (HOSPITAL_COMMUNITY): Payer: Self-pay

## 2024-07-10 ENCOUNTER — Other Ambulatory Visit (HOSPITAL_BASED_OUTPATIENT_CLINIC_OR_DEPARTMENT_OTHER): Payer: Self-pay

## 2024-07-15 ENCOUNTER — Telehealth: Payer: Self-pay | Admitting: Pharmacy Technician

## 2024-07-15 NOTE — Telephone Encounter (Signed)
 Pharmacy Patient Advocate Encounter   Received notification from Fax that prior authorization for repatha is required/requested.   Insurance verification completed.   The patient is insured through [facility] .   Per test claim: PA required; PA submitted to above mentioned insurance via Fax Key/confirmation #/EOC faxed Status is pending

## 2024-07-15 NOTE — Telephone Encounter (Signed)
 Pharmacy Patient Advocate Encounter  Received notification from [facility] that Prior Authorization for repatha  has been APPROVED from 07/15/24 to 07/15/25   PA #/Case ID/Reference #: [id number]

## 2024-08-12 ENCOUNTER — Other Ambulatory Visit: Payer: Self-pay

## 2024-08-12 DIAGNOSIS — R002 Palpitations: Secondary | ICD-10-CM

## 2024-08-12 MED ORDER — METOPROLOL TARTRATE 25 MG PO TABS: 25.0000 mg | ORAL_TABLET | Freq: Two times a day (BID) | ORAL | 0 refills | Status: DC

## 2024-08-21 ENCOUNTER — Other Ambulatory Visit (HOSPITAL_COMMUNITY): Payer: Self-pay

## 2024-08-21 ENCOUNTER — Encounter: Payer: Self-pay | Admitting: Internal Medicine

## 2024-08-21 ENCOUNTER — Ambulatory Visit: Payer: Self-pay | Attending: Internal Medicine | Admitting: Internal Medicine

## 2024-08-21 ENCOUNTER — Other Ambulatory Visit: Payer: Self-pay

## 2024-08-21 VITALS — BP 126/80 | HR 73 | Ht 65.0 in | Wt 175.4 lb

## 2024-08-21 DIAGNOSIS — R931 Abnormal findings on diagnostic imaging of heart and coronary circulation: Secondary | ICD-10-CM

## 2024-08-21 DIAGNOSIS — R42 Dizziness and giddiness: Secondary | ICD-10-CM

## 2024-08-21 DIAGNOSIS — R002 Palpitations: Secondary | ICD-10-CM

## 2024-08-21 DIAGNOSIS — I10 Essential (primary) hypertension: Secondary | ICD-10-CM

## 2024-08-21 DIAGNOSIS — E785 Hyperlipidemia, unspecified: Secondary | ICD-10-CM | POA: Diagnosis not present

## 2024-08-21 DIAGNOSIS — Z79899 Other long term (current) drug therapy: Secondary | ICD-10-CM

## 2024-08-21 MED ORDER — AMLODIPINE BESYLATE 5 MG PO TABS
5.0000 mg | ORAL_TABLET | Freq: Every day | ORAL | 3 refills | Status: AC
  Filled 2024-08-21 – 2024-12-18 (×4): qty 90, 90d supply, fill #0

## 2024-08-21 MED ORDER — METOPROLOL TARTRATE 25 MG PO TABS
25.0000 mg | ORAL_TABLET | Freq: Two times a day (BID) | ORAL | 3 refills | Status: AC
  Filled 2024-08-21 – 2024-12-09 (×3): qty 180, 90d supply, fill #0

## 2024-08-21 MED ORDER — ROSUVASTATIN CALCIUM 5 MG PO TABS
5.0000 mg | ORAL_TABLET | Freq: Every day | ORAL | 3 refills | Status: AC
  Filled 2024-08-21 (×2): qty 90, 90d supply, fill #0
  Filled 2024-12-09: qty 90, 90d supply, fill #1

## 2024-08-21 NOTE — Patient Instructions (Addendum)
 Medication Instructions:  RESTART Crestor  5mg  Take 1 tablet once a day  STOP Repatha   *If you need a refill on your cardiac medications before your next appointment, please call your pharmacy*  Lab Work: 3 MONTHS FASTING-LIPIDS & LFTS (ON OR AROUND 11/21/2024) If you have labs (blood work) drawn today and your tests are completely normal, you will receive your results only by: MyChart Message (if you have MyChart) OR A paper copy in the mail If you have any lab test that is abnormal or we need to change your treatment, we will call you to review the results.  Testing/Procedures: NONE ORDERED  Follow-Up: At [facility], you and your health needs are our priority.  As part of our continuing mission to provide you with exceptional heart care, our providers are all part of one team.  This team includes your primary Cardiologist (physician) and Advanced Practice Providers or APPs (Physician Assistants and Nurse Practitioners) who all work together to provide you with the care you need, when you need it.  Your next appointment:   12 month(s)  Provider:   Gayatri A Acharya, MD    We recommend signing up for the patient portal called MyChart.  Sign up information is provided on this After Visit Summary.  MyChart is used to connect with patients for Virtual Visits (Telemedicine).  Patients are able to view lab/test results, encounter notes, upcoming appointments, etc.  Non-urgent messages can be sent to your provider as well.   To learn more about what you can do with MyChart, go to [email or web address].   Other Instructions

## 2024-08-21 NOTE — Progress Notes (Signed)
 Cardiology Office Note:  .   Date:  08/21/2024  ID:  Breanna Wang, DOB Apr 04, 1966, MRN [id number] PCP: Freddrick Johns  [facility] Providers Cardiologist:  Soyla DELENA Merck, MD    History of Present Illness: .   Breanna Wang is a 58 y.o. female.  Discussed the use of AI scribe software for clinical note transcription with the patient, who gave verbal consent to proceed.  History of Present Illness Breanna Wang is a 58 year old female who presents with joint pain after Repatha  use.  She experiences significant joint pain, especially in her knees, after starting Repatha . She initially took two shots a month but reduced to one due to the pain. The joint pain subsided after discontinuing Repatha .   She previously used semaglutide for several months but stopped due to cost. She did not experience joint pain with semaglutide, and her cholesterol levels were well-controlled on Repatha .  She has used rosuvastatin  (Crestor ) at a dose of 5 mg, which did not cause muscle aches but was associated with GU burning, possibly related to menopausal symptoms.  Her current medications include metoprolol  25 mg twice a day and amlodipine  5 mg daily for blood pressure management. She reports improved snoring with weight loss.    ROS: negative except per HPI above.  Studies Reviewed: SABRA   EKG Interpretation Date/Time:  Wednesday August 21 2024 08:19:51 EDT Ventricular Rate:  73 PR Interval:  138 QRS Duration:  86 QT Interval:  378 QTC Calculation: 416 R Axis:   24  Text Interpretation: Normal sinus rhythm Normal ECG Confirmed by Merck Soyla ([id number]) on 08/21/2024 8:49:38 AM    Results DIAGNOSTIC Electrocardiogram (EKG): Normal Risk Assessment/Calculations:       Physical Exam:   VS:  BP 126/80   Pulse 73   Ht 5' 5 (1.651 m)   Wt 175 lb 6.4 oz (79.6 kg)   LMP 09/04/2020   SpO2 99%   BMI 29.19 kg/m    Wt Readings from Last 3 Encounters:  08/21/24 175 lb  6.4 oz (79.6 kg)  08/15/23 187 lb (84.8 kg)  07/31/23 189 lb (85.7 kg)     Physical Exam GENERAL: Alert, cooperative, well developed, no acute distress. HEENT: Normocephalic, normal oropharynx, moist mucous membranes. CHEST: Clear to auscultation bilaterally, no wheezes, rhonchi, or crackles. CARDIOVASCULAR: Normal heart rate and rhythm, S1 and S2 normal without murmurs, no carotid bruits. ABDOMEN: Soft, non-tender, non-distended, without organomegaly, normal bowel sounds. EXTREMITIES: No cyanosis or edema. NEUROLOGICAL: Cranial nerves grossly intact, moves all extremities without gross motor or sensory deficit.   ASSESSMENT AND PLAN: .    Assessment and Plan Assessment & Plan Hyperlipidemia Repatha  discontinued due to joint pain. Rosuvastatin  previously tolerated without muscle aches. Cholesterol well-controlled on Repatha . - Restart rosuvastatin  5 mg daily. - Call in prescription for rosuvastatin  to the pharmacy. - Order liver function tests and lipid panel in 3 months.  Essential hypertension Blood pressure well-controlled on metoprolol  and amlodipine . Recent weight loss improved control. - Continue metoprolol  25 mg twice daily. - Continue amlodipine  5 mg daily.  Vertigo/dizziness - previously evaluated, no cardiac cause. 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 Such an appearance can be associated with cranial neuropathy. From brain MRI - Discuss vertigo with primary care physician. - Consider referral to ENT  for further evaluation.      Soyla Merck, MD, FACC

## 2024-08-23 ENCOUNTER — Other Ambulatory Visit: Payer: Self-pay | Admitting: Internal Medicine

## 2024-08-23 DIAGNOSIS — R931 Abnormal findings on diagnostic imaging of heart and coronary circulation: Secondary | ICD-10-CM

## 2024-08-23 DIAGNOSIS — E785 Hyperlipidemia, unspecified: Secondary | ICD-10-CM

## 2024-08-23 DIAGNOSIS — I2510 Atherosclerotic heart disease of native coronary artery without angina pectoris: Secondary | ICD-10-CM

## 2024-11-27 ENCOUNTER — Encounter: Payer: Self-pay | Admitting: Podiatry

## 2024-11-27 ENCOUNTER — Ambulatory Visit (INDEPENDENT_AMBULATORY_CARE_PROVIDER_SITE_OTHER)

## 2024-11-27 ENCOUNTER — Ambulatory Visit: Admitting: Podiatry

## 2024-11-27 DIAGNOSIS — M19072 Primary osteoarthritis, left ankle and foot: Secondary | ICD-10-CM | POA: Diagnosis not present

## 2024-11-27 DIAGNOSIS — M7752 Other enthesopathy of left foot: Secondary | ICD-10-CM | POA: Diagnosis not present

## 2024-11-27 MED ORDER — TRIAMCINOLONE ACETONIDE 10 MG/ML IJ SUSP
10.0000 mg | Freq: Once | INTRAMUSCULAR | Status: AC
  Administered 2024-11-27: 10 mg via INTRA_ARTICULAR

## 2024-11-27 MED ORDER — DICLOFENAC SODIUM 75 MG PO TBEC: 75.0000 mg | DELAYED_RELEASE_TABLET | Freq: Two times a day (BID) | ORAL | 2 refills | Status: AC

## 2024-11-27 NOTE — Progress Notes (Signed)
 Subjective:   Patient ID: Breanna Wang, female   DOB: 59 y.o.   MRN: [id number]   HPI Patient states she is having a lot of pain underneath the left foot for approximate 3 months after wearing new pair shoes.  States it hurts to walk on and that she also started medicine for cholesterol and not sure if this may be part of it.  Patient does not smoke likes to be active   Review of Systems  All other systems reviewed and are negative.       Objective:  Physical Exam Vitals and nursing note reviewed.  Constitutional:      Appearance: She is well-developed.  Pulmonary:     Effort: Pulmonary effort is normal.  Musculoskeletal:        General: Normal range of motion.  Skin:    General: Skin is warm.  Neurological:     Mental Status: She is alert.     Neurovascular status intact muscle strength found to be adequate range of motion within normal limits with patient noted to have inflammation and fluid in the subfirst metatarsal left mostly around the fibular sesamoid.  It is moderately tender when pressed and worse that she does more prolonged walking and patient does not always walk on that side of her foot.  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

## 2024-12-06 ENCOUNTER — Other Ambulatory Visit: Payer: Self-pay | Admitting: Internal Medicine

## 2024-12-06 DIAGNOSIS — R002 Palpitations: Secondary | ICD-10-CM

## 2024-12-06 DIAGNOSIS — I10 Essential (primary) hypertension: Secondary | ICD-10-CM

## 2024-12-09 ENCOUNTER — Other Ambulatory Visit: Payer: Self-pay

## 2024-12-09 ENCOUNTER — Other Ambulatory Visit (HOSPITAL_COMMUNITY): Payer: Self-pay

## 2024-12-18 ENCOUNTER — Other Ambulatory Visit: Payer: Self-pay

## 2024-12-18 ENCOUNTER — Ambulatory Visit: Admitting: Podiatry
